# Patient Record
Sex: Male | Born: 1980 | Race: White | Hispanic: No | Marital: Married | Smoking: Never smoker
Health system: Southern US, Community
[De-identification: ages and names within clinical notes are randomized; demographics above are authoritative.]

## PROBLEM LIST (undated history)

## (undated) DIAGNOSIS — K219 Gastro-esophageal reflux disease without esophagitis: Secondary | ICD-10-CM

## (undated) DIAGNOSIS — G44209 Tension-type headache, unspecified, not intractable: Secondary | ICD-10-CM

## (undated) DIAGNOSIS — D4709 Other mast cell neoplasms of uncertain behavior: Secondary | ICD-10-CM

## (undated) DIAGNOSIS — E785 Hyperlipidemia, unspecified: Secondary | ICD-10-CM

## (undated) DIAGNOSIS — K760 Fatty (change of) liver, not elsewhere classified: Secondary | ICD-10-CM

## (undated) DIAGNOSIS — J302 Other seasonal allergic rhinitis: Secondary | ICD-10-CM

## (undated) HISTORY — DX: Tension-type headache, unspecified, not intractable: G44.209

## (undated) HISTORY — DX: Fatty (change of) liver, not elsewhere classified: K76.0

## (undated) HISTORY — DX: Other seasonal allergic rhinitis: J30.2

## (undated) HISTORY — DX: Other mast cell neoplasms of uncertain behavior: D47.09

## (undated) HISTORY — DX: Hyperlipidemia, unspecified: E78.5

## (undated) HISTORY — DX: Gastro-esophageal reflux disease without esophagitis: K21.9

---

## 2008-03-19 LAB — HEPATIC FUNCTION PANEL
ALT: 50 U/L — AB (ref 10–40)
AST: 29 U/L
GGT: 274
Total Bilirubin: 0.4 mg/dL

## 2008-03-19 LAB — GLUCOSE, RANDOM: Fructosamine: 2.3

## 2008-03-19 LAB — LIPID PANEL: Cholesterol, Total: 202

## 2008-03-19 LAB — HEPATITIS B SURFACE ANTIGEN: Hepatitis B Surface Antigen: NEGATIVE

## 2008-03-19 LAB — HEPATITIS C ANTIBODY: Hepatitis C Ab: NEGATIVE

## 2008-04-02 HISTORY — PX: OTHER SURGICAL HISTORY: SHX169

## 2008-07-13 LAB — HEPATIC FUNCTION PANEL
Alkaline Phosphatase: 65 U/L
Bilirubin, Direct: 0.11 mg/dL (ref 0.01–0.4)
Total Bilirubin: 0.3 mg/dL
Total Protein: 7.3 g/dL

## 2008-07-13 LAB — LIPID PANEL: Triglycerides: 66

## 2010-04-14 ENCOUNTER — Ambulatory Visit (INDEPENDENT_AMBULATORY_CARE_PROVIDER_SITE_OTHER): Payer: PRIVATE HEALTH INSURANCE | Admitting: Family Medicine

## 2010-04-14 ENCOUNTER — Encounter: Payer: Self-pay | Admitting: Family Medicine

## 2010-04-14 ENCOUNTER — Other Ambulatory Visit: Payer: Self-pay | Admitting: Family Medicine

## 2010-04-14 DIAGNOSIS — E669 Obesity, unspecified: Secondary | ICD-10-CM | POA: Insufficient documentation

## 2010-04-14 DIAGNOSIS — K7689 Other specified diseases of liver: Secondary | ICD-10-CM

## 2010-04-14 DIAGNOSIS — K219 Gastro-esophageal reflux disease without esophagitis: Secondary | ICD-10-CM

## 2010-04-14 DIAGNOSIS — E1169 Type 2 diabetes mellitus with other specified complication: Secondary | ICD-10-CM | POA: Insufficient documentation

## 2010-04-14 DIAGNOSIS — E785 Hyperlipidemia, unspecified: Secondary | ICD-10-CM | POA: Insufficient documentation

## 2010-04-14 DIAGNOSIS — L989 Disorder of the skin and subcutaneous tissue, unspecified: Secondary | ICD-10-CM | POA: Insufficient documentation

## 2010-04-14 DIAGNOSIS — R7309 Other abnormal glucose: Secondary | ICD-10-CM | POA: Insufficient documentation

## 2010-04-14 DIAGNOSIS — G44209 Tension-type headache, unspecified, not intractable: Secondary | ICD-10-CM

## 2010-04-14 DIAGNOSIS — E66811 Obesity, class 1: Secondary | ICD-10-CM | POA: Insufficient documentation

## 2010-04-14 DIAGNOSIS — E663 Overweight: Secondary | ICD-10-CM | POA: Insufficient documentation

## 2010-04-14 DIAGNOSIS — K760 Fatty (change of) liver, not elsewhere classified: Secondary | ICD-10-CM | POA: Insufficient documentation

## 2010-04-14 DIAGNOSIS — N509 Disorder of male genital organs, unspecified: Secondary | ICD-10-CM | POA: Insufficient documentation

## 2010-04-14 DIAGNOSIS — J309 Allergic rhinitis, unspecified: Secondary | ICD-10-CM | POA: Insufficient documentation

## 2010-04-14 LAB — CBC WITH DIFFERENTIAL/PLATELET
Basophils Relative: 0.5 % (ref 0.0–3.0)
Eosinophils Relative: 0.4 % (ref 0.0–5.0)
HCT: 40.3 % (ref 39.0–52.0)
MCV: 81.8 fl (ref 78.0–100.0)
Monocytes Absolute: 0.5 10*3/uL (ref 0.1–1.0)
Monocytes Relative: 7.9 % (ref 3.0–12.0)
Neutrophils Relative %: 77.8 % — ABNORMAL HIGH (ref 43.0–77.0)
RBC: 4.93 Mil/uL (ref 4.22–5.81)
WBC: 6.3 10*3/uL (ref 4.5–10.5)

## 2010-04-14 LAB — BASIC METABOLIC PANEL
Chloride: 99 mEq/L (ref 96–112)
Creatinine, Ser: 1 mg/dL (ref 0.4–1.5)
Potassium: 4.1 mEq/L (ref 3.5–5.1)

## 2010-04-14 LAB — LIPID PANEL
HDL: 35.9 mg/dL — ABNORMAL LOW (ref >39.00)
Triglycerides: 96 mg/dL (ref 0.0–149.0)
VLDL: 19.2 mg/dL (ref 0.0–40.0)

## 2010-04-14 LAB — HEPATIC FUNCTION PANEL
ALT: 42 U/L (ref 0–53)
AST: 28 U/L (ref 0–37)
Bilirubin, Direct: 0.1 mg/dL (ref 0.0–0.3)
Total Protein: 7.8 g/dL (ref 6.0–8.3)

## 2010-04-14 LAB — TSH: TSH: 0.99 u[IU]/mL (ref 0.35–5.50)

## 2010-04-22 NOTE — Assessment & Plan Note (Signed)
Summary: NEW PATIENT EST / LFW   Vital Signs:  Patient profile:   30 year old male Height:      70.5 inches Weight:      215 pounds BMI:     30.52 Temp:     98.4 degrees F oral Pulse rate:   80 / minute Pulse rhythm:   regular BP sitting:   124 / 64  (left arm) Cuff size:   large  Vitals Entered By: Selena Batten Dance CMA Duncan Dull) (April 14, 2010 9:36 AM) CC: New patient to establish care   History of Present Illness: CC: new patient, establish  previously saw [facility] in [place], [place].  went there for early signs of fatty liver disease.  did liver US, minimal dz.  Also with gallbladder polyps.  Saw GI, told could handle with diet/exercise changes.  prescribed some medicine to lower trig's stopped taking because wanted to try diet changes, too expensive.  never had liver bx.  h/o mastocytosis.  saw derm, told should go away over time.  manifested as skin breakoutw hen went to [facility] for honeymoon.  testicular issues - has had pain in past, currently no pain.  No lumps/bumps.  comes and goes.  hasn't been seen for that before.  preventative: tetanus shot - 2006 no recent blood work (last 2010)  -  Date:  02/02/2006    TD booster Td per pt  Current Medications (verified): 1)  Loratadine 10 Mg Tabs (Loratadine) .Marland Kitchen.. 1 By Mouth Once Daily As Needed  Allergies (verified): No Known Drug Allergies  Past History:  Past Medical History: GERD allergic rhinitis mastocytosis? NAFLD HLD tension headache  Past Surgical History: none  Family History: M: T2DM F: HTN grandparents: CAD (none early), breast CA, CVA,  MGF: kidney disease, CEA, deafness PGF: melanoma MGM: CHF  No other CA  Social History: no smoking, no EtOH, no rec drugs caffeine: 16-32oz soda Occupation: physical therapist, [employer] Lives with wife and daughter (2009), 1 dog, 1 cat Edu: Grad school, doctor of PT  Review of Systems  The patient denies anorexia, fever, weight loss, weight  gain, vision loss, decreased hearing, hoarseness, chest pain, syncope, dyspnea on exertion, peripheral edema, prolonged cough, headaches, hemoptysis, abdominal pain, melena, hematochezia, severe indigestion/heartburn, hematuria, depression, and testicular masses.         10pt ros o/w neg.  Physical Exam  General:  Well-developed,well-nourished,in no acute distress; alert,appropriate and cooperative throughout examination Head:  Normocephalic and atraumatic without obvious abnormalities. No apparent alopecia or balding. Eyes:  No corneal or conjunctival inflammation noted. EOMI. Perrla. Ears:  TMs clear bilaterally Nose:  nares clear  Mouth:  pharynx without erythema/exudates, MMM, good dentition.   Neck:  No deformities, masses, or tenderness noted. Lungs:  Normal respiratory effort, chest expands symmetrically. Lungs are clear to auscultation, no crackles or wheezes. Heart:  Normal rate and regular rhythm. S1 and S2 normal without gallop, murmur, click, rub or other extra sounds. Abdomen:  Bowel sounds positive,abdomen soft and non-tender without masses, organomegaly or hernias noted. Genitalia:  Testes bilaterally descended without nodularity, tenderness or masses. No scrotal masses or lesions. No penis lesions or urethral discharge.  Msk:  No deformity or scoliosis noted of thoracic or lumbar spine.   Pulses:  2+ rad pulses bilaterally, brisk cap refill Extremities:  no pedal edema Neurologic:  CN grossly intact, station and gait intact Skin:  Intact without suspicious lesions or rashes Psych:  full affect, pleasant and cooperative   Impression &  Recommendations:  Problem # 1:  FATTY LIVER DISEASE (ICD-571.8) discussed etiology, progression to cirrhosis (sounds like not likely).  ?NASH vs HS.  check blood work, request records, may rec statin.  encouraged low fat diet, start exercise routine (walking with family?)  Orders: Venipuncture (21308) TLB-BMP (Basic Metabolic Panel-BMET)  (80048-METABOL) TLB-CBC Platelet - w/Differential (85025-CBCD) TLB-Hepatic/Liver Function Pnl (80076-HEPATIC) TLB-TSH (Thyroid Stimulating Hormone) (84443-TSH) TLB-Lipid Panel (80061-LIPID)  Problem # 2:  GERD (ICD-530.81) diet controlled.  Problem # 3:  DYSLIPIDEMIA (ICD-272.4) check FLP   Problem # 4:  SKIN DISORDER (ICD-709.9) per pt h/o cutaneous mastocytosis.  saw derm.  told should clear up on its own.  Problem # 5:  ALLERGIC RHINITIS (ICD-477.9) controlled on loratadine.  His updated medication list for this problem includes:    Loratadine 10 Mg Tabs (Loratadine) .Marland Kitchen... 1 by mouth once daily as needed  Problem # 6:  TESTICULAR PAIN, LEFT (ICD-608.9) currently not bothering him, exam wnl.  ? noninfectious epididymitis.  monitor, return if flares.  Complete Medication List: 1)  Loratadine 10 Mg Tabs (Loratadine) .Marland Kitchen.. 1 by mouth once daily as needed  Patient Instructions: 1)  we will get records from prior PCP. 2)  Blood work today. 3)  Return in 1-2 years or as needed for next physical. 4)  Call us with questions, good to meet you today.   Orders Added: 1)  Venipuncture [36415] 2)  TLB-BMP (Basic Metabolic Panel-BMET) [80048-METABOL] 3)  TLB-CBC Platelet - w/Differential [85025-CBCD] 4)  TLB-Hepatic/Liver Function Pnl [80076-HEPATIC] 5)  TLB-TSH (Thyroid Stimulating Hormone) [84443-TSH] 6)  TLB-Lipid Panel [80061-LIPID] 7)  New Patient Level IV [65784]    Current Allergies (reviewed today): No known allergies   Appended Document: NEW PATIENT EST / LFW    Clinical Lists Changes  Problems: Added new problem of PREDIABETES (ICD-790.29)

## 2010-06-22 ENCOUNTER — Encounter: Payer: Self-pay | Admitting: Family Medicine

## 2010-06-24 ENCOUNTER — Encounter: Payer: Self-pay | Admitting: Family Medicine

## 2010-06-25 ENCOUNTER — Encounter: Payer: Self-pay | Admitting: Family Medicine

## 2011-10-27 ENCOUNTER — Encounter: Payer: Self-pay | Admitting: Family Medicine

## 2011-10-27 ENCOUNTER — Ambulatory Visit (INDEPENDENT_AMBULATORY_CARE_PROVIDER_SITE_OTHER): Payer: PRIVATE HEALTH INSURANCE | Admitting: Family Medicine

## 2011-10-27 VITALS — BP 118/76 | HR 72 | Temp 98.5°F | Ht 70.5 in | Wt 196.8 lb

## 2011-10-27 DIAGNOSIS — R1011 Right upper quadrant pain: Secondary | ICD-10-CM

## 2011-10-27 DIAGNOSIS — Z0000 Encounter for general adult medical examination without abnormal findings: Secondary | ICD-10-CM | POA: Insufficient documentation

## 2011-10-27 DIAGNOSIS — E663 Overweight: Secondary | ICD-10-CM

## 2011-10-27 NOTE — Patient Instructions (Signed)
Pass by Alan Barber's office for referral for abdominal ultrasound. Good job with weight and diet - keep it up. Good to see you today. Call us with questions.

## 2011-10-27 NOTE — Assessment & Plan Note (Signed)
Preventative protocols reviewed and updated unless pt declined. Discussed healthy diet and lifestyle. Encouraged healthy diet changes, weight loss through increased activity. Declines flu shot today.

## 2011-10-27 NOTE — Assessment & Plan Note (Signed)
Longstanding, some biliary colic characteristics.  H/o gallbladder polyps.  H/o NAFLD.  Repeat abd [place] and check LFTs.  Will decide plan based on results.  Pt agrees with plan. Knows red flags to seek urgent care.

## 2011-10-27 NOTE — Progress Notes (Signed)
Subjective:    Patient ID: Alan Barber, male    DOB: [birth year], 31 y.o.   MRN: [id number]  HPI CC: CPE  RUQ pain - ?gallbladder pain - off and on for last 5 years.  Worsening over last 1.5 yrs.  Works discomfort after fatty meals - pressure/bloating in RUQ.  Had 2 episodes of significant pain associated with this as well.  Pain described as dull pain lasting 4-5 hours, crescendo worsening pain.  Almost went to ER.  Ibuprofen helped.  This happened early 2013.  Some diarrhea associated with this.  H/o abd [place] with gallbladder polyps and 2 small stones per pt.  H/o NAFLD.  Prior saw GI in [place] (Dr. Danielle Dess?)  No nausea/vomiting, diarrhea/constipation, fevers/chills.  Lost 10lbs through fast at church.  also started P90X in fall.  Has regained some of this weight. 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  Past histories reviewed and updated if relevant as below. Patient Active Problem List  Diagnosis  . DYSLIPIDEMIA  . OVERWEIGHT  . TENSION HEADACHE  . ALLERGIC RHINITIS  . GERD  . FATTY LIVER DISEASE  . TESTICULAR PAIN, LEFT  . SKIN DISORDER  . PREDIABETES   Past Medical History  Diagnosis Date  . GERD (gastroesophageal reflux disease)   . Seasonal allergies   . Mastocytosis   . Nonalcoholic fatty liver disease     has seen GI (Dr. Danielle Dess, [place])  . HLD (hyperlipidemia)   . Tension headache   . Hypertriglyceridemia    Past Surgical History  Procedure Date  . Abd ultrasound 04/2008    small gallbladder polyps, mild fatty infiltration of liver   History  Substance Use  Topics  . Smoking status: Never Smoker   . Smokeless tobacco: Never Used  . Alcohol Use: No   Family History  Problem Relation Age of Onset  . Diabetes type II Mother   . Hypertension Father   . Breast cancer Paternal Grandmother   . Kidney disease Maternal Grandfather   . Cancer Paternal Grandfather     skin (?melanoma)  . Heart disease Maternal Grandmother    No Known Allergies No current outpatient prescriptions on file prior to visit.      Review of Systems  Constitutional: Negative for fever, chills, activity change, appetite change, fatigue and unexpected weight change.  HENT: Negative for hearing loss and neck pain.   Eyes: Negative for visual disturbance.  Respiratory: Negative for cough, chest tightness, shortness of breath and wheezing.   Cardiovascular: Negative for chest pain, palpitations and leg swelling.  Gastrointestinal: Positive for abdominal pain and blood in stool (with wiping). Negative for nausea, vomiting, diarrhea, constipation and abdominal distention.  Genitourinary: Negative for hematuria and difficulty urinating.  Musculoskeletal: Negative for myalgias and arthralgias.  Skin: Negative for rash.  Neurological: Negative for dizziness, seizures, syncope and headaches.  Hematological: Does not bruise/bleed easily.  Psychiatric/Behavioral: Negative for dysphoric mood. The patient is not nervous/anxious.        Objective:   Physical Exam  Nursing note and vitals reviewed. Constitutional: He is oriented to person, place, and time. He  appears well-developed and well-nourished. No distress.  HENT:  Head: Normocephalic and atraumatic.  Right Ear: External ear normal.  Left Ear: External ear normal.  Nose: Nose normal.  Mouth/Throat: Oropharynx is clear and moist. No oropharyngeal exudate.  Eyes: Conjunctivae normal and EOM are normal. Pupils are equal, round, and reactive to light. No scleral icterus.  Neck: Normal range of motion. Neck supple. Carotid  bruit is not present.  Cardiovascular: Normal rate, regular rhythm, normal heart sounds and intact distal pulses.   No murmur heard. Pulses:      Radial pulses are 2+ on the right side, and 2+ on the left side.  Pulmonary/Chest: Effort normal and breath sounds normal. No respiratory distress. He has no wheezes. He has no rales.  Abdominal: Soft. Bowel sounds are normal. He exhibits no distension and no mass. There is no hepatosplenomegaly. There is no tenderness. There is no rebound, no guarding, no CVA tenderness and negative Murphy's sign.       Mild discomfort to deep palpation RUQ with inspiration  Musculoskeletal: Normal range of motion. He exhibits no edema.  Lymphadenopathy:    He has no cervical adenopathy.  Neurological: He is alert and oriented to person, place, and time.       CN grossly intact, station and gait intact  Skin: Skin is warm and dry. No rash noted.  Psychiatric: He has a normal mood and affect. His behavior is normal. Judgment and thought content normal.       Assessment & Plan:

## 2011-10-27 NOTE — Assessment & Plan Note (Signed)
Body mass index is 27.83 kg/(m^2). Return fasting for blood work.

## 2011-10-28 ENCOUNTER — Ambulatory Visit: Payer: Self-pay | Admitting: Family Medicine

## 2011-10-28 ENCOUNTER — Other Ambulatory Visit: Payer: Self-pay | Admitting: Family Medicine

## 2011-10-28 ENCOUNTER — Encounter: Payer: Self-pay | Admitting: Family Medicine

## 2011-10-28 DIAGNOSIS — K8050 Calculus of bile duct without cholangitis or cholecystitis without obstruction: Secondary | ICD-10-CM

## 2011-10-28 DIAGNOSIS — R1011 Right upper quadrant pain: Secondary | ICD-10-CM

## 2011-10-28 DIAGNOSIS — K8020 Calculus of gallbladder without cholecystitis without obstruction: Secondary | ICD-10-CM

## 2011-11-03 HISTORY — PX: LAPAROSCOPIC CHOLECYSTECTOMY: SUR755

## 2011-12-01 ENCOUNTER — Ambulatory Visit: Payer: Self-pay | Admitting: Surgery

## 2011-12-18 ENCOUNTER — Encounter: Payer: Self-pay | Admitting: Family Medicine

## 2013-12-28 IMAGING — US ABDOMEN ULTRASOUND
1 series · 13 of 25 positions shown · non-contrast
Comparison: none

REASON FOR EXAM: RUQ abd pain
COMMENTS:

[Series 1: abdomen ultrasound · 0.31mm/px · 13 of 122 slices shown]
[im 1/122]
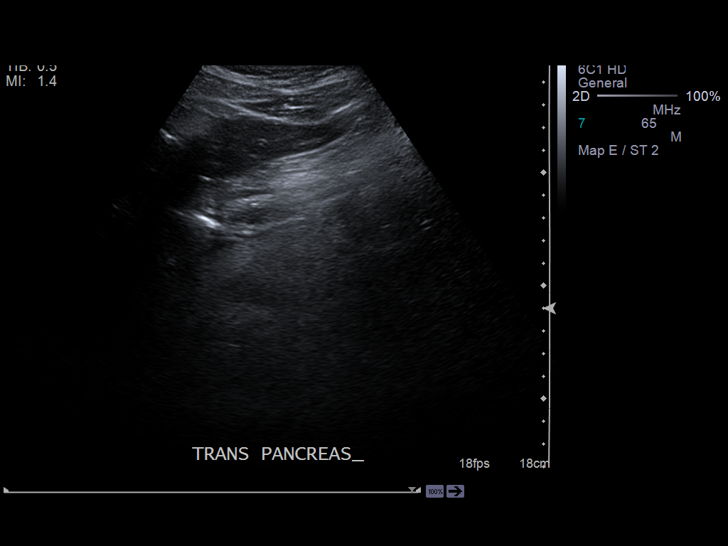
[im 11/122]
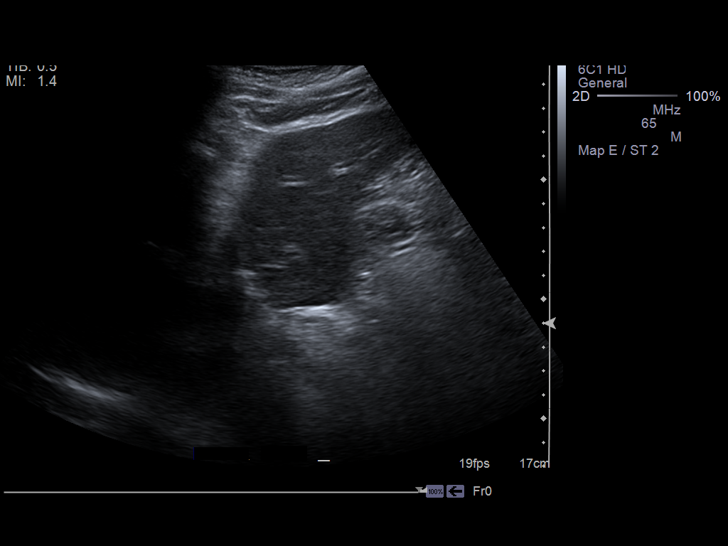
[im 21/122]
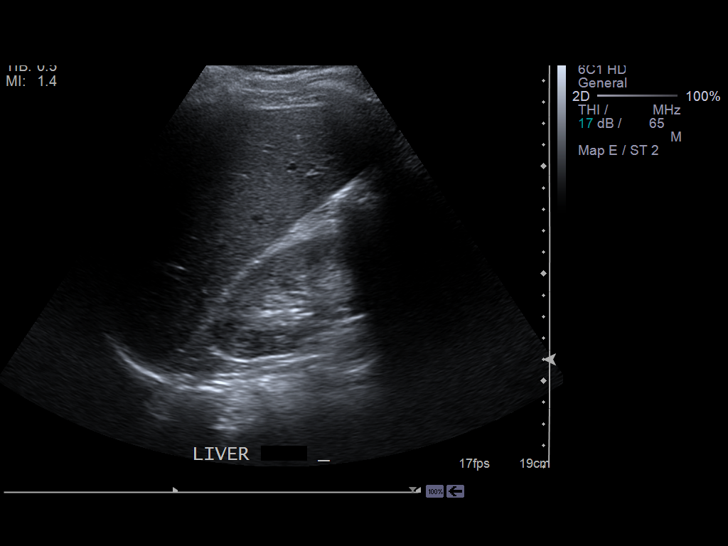
[im 31/122]
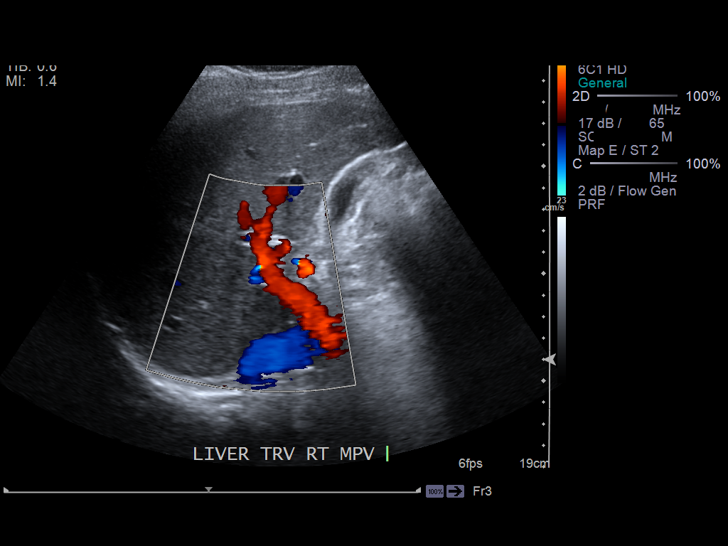
[im 41/122]
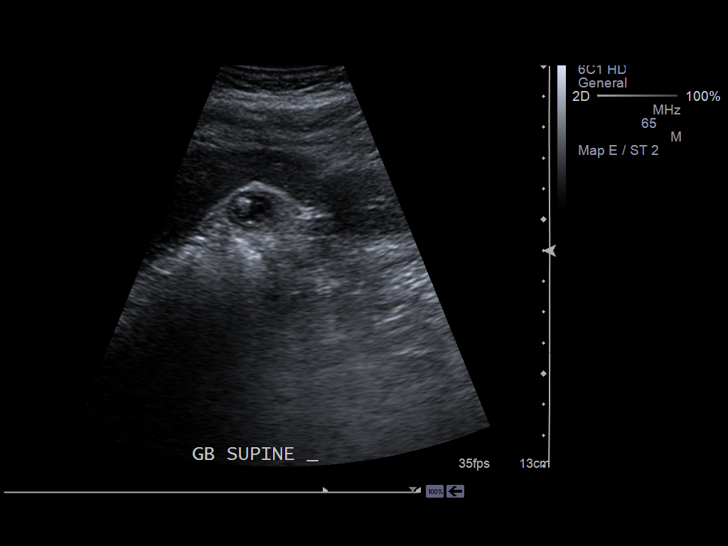
[im 51/122]
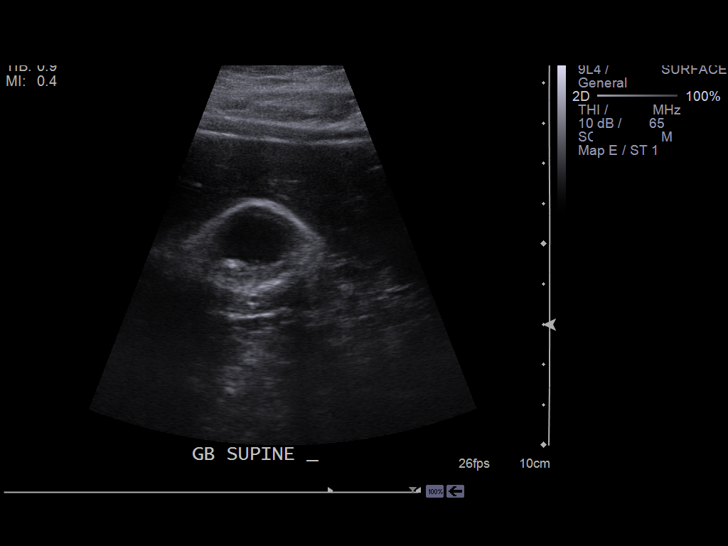
[im 61/122]
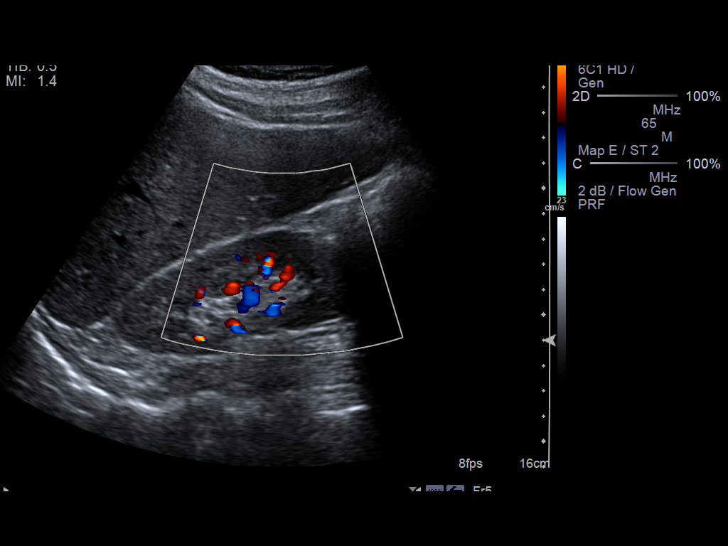
[im 71/122]
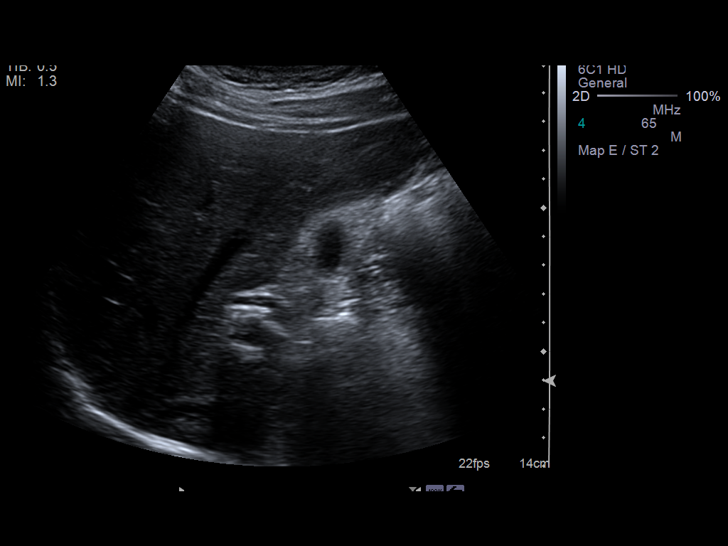
[im 81/122]
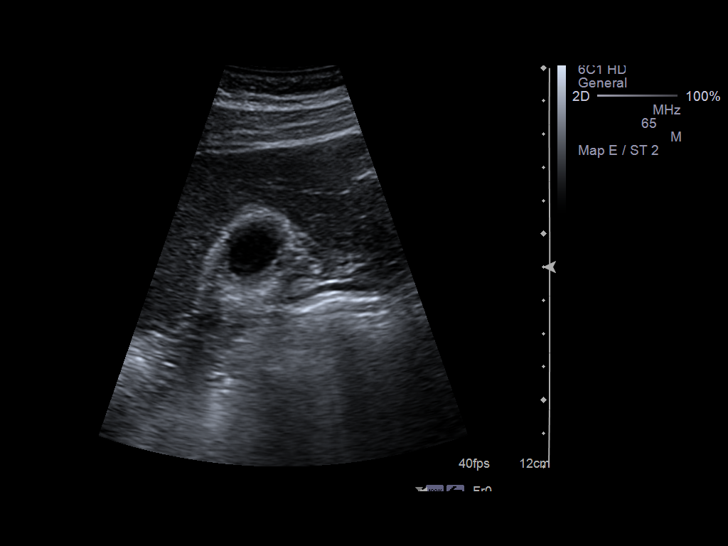
[im 91/122]
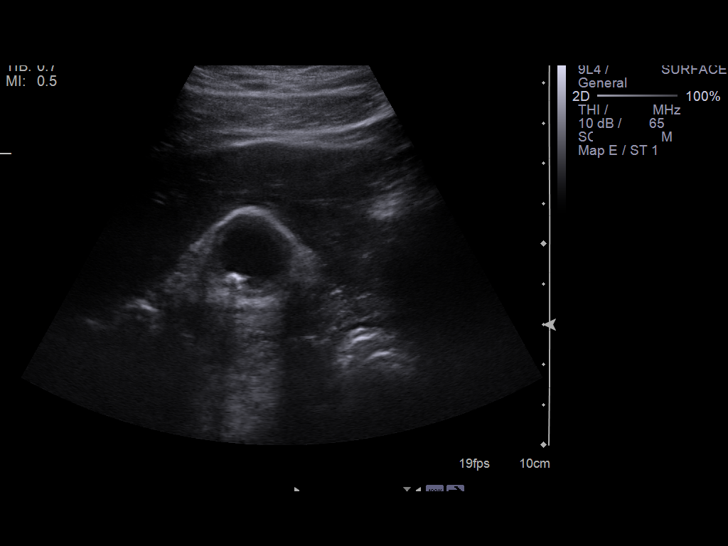
[im 101/122]
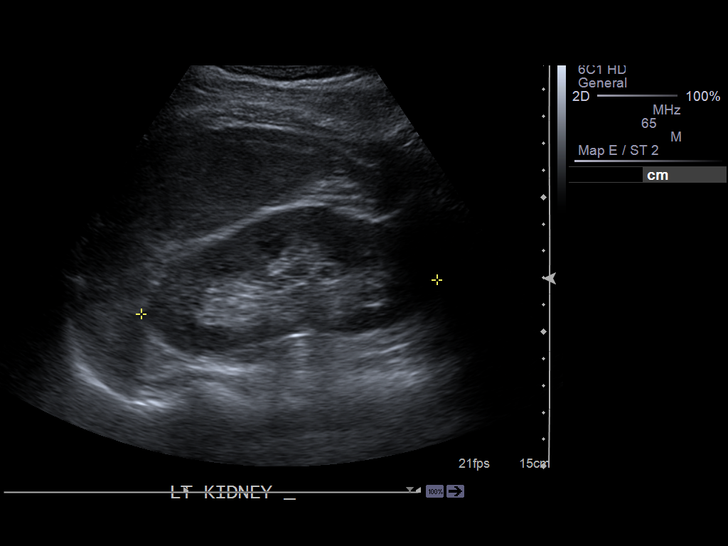
[im 111/122]
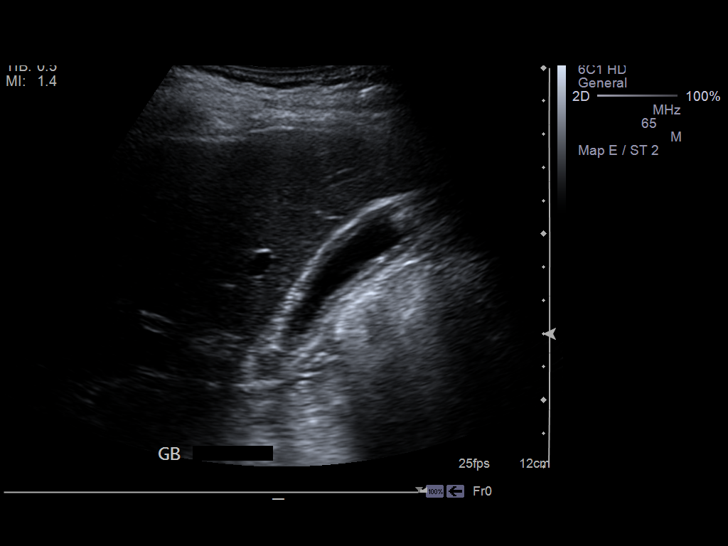
[im 122/122]
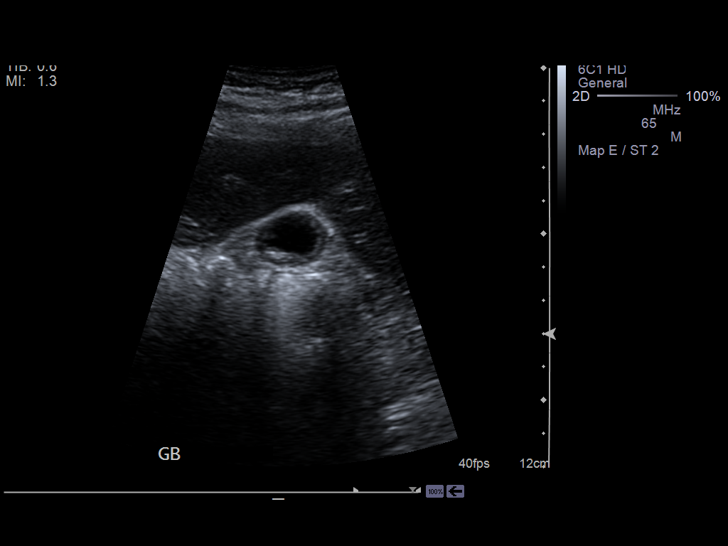

[13 of 25 positions shown; findings below may reference images not displayed]

PROCEDURE:     US  - US ABDOMEN GENERAL SURVEY  - October 28, 2011  [DATE]

RESULT:     The liver exhibits normal echotexture with no focal mass nor
ductal dilation. Portal venous flow is normal in direction toward the liver.
There is no evidence of ascites. The gallbladder is adequately distended and
contains multiple echogenic and partially shadowing stones which move
somewhat but the gallbladder is contracted. The wall is thickened at 4.4 mm.
There is no pericholecystic fluid. There is no positive sonographic Murphy's
sign. There is echogenic material associated with the gallbladder wall which
could reflect the presence of mural calcification.

The common bile duct is normal at 4.2 mm. Evaluation of the pancreas was
limited by bowel gas. The spleen, abdominal aorta, inferior vena cava, and
kidneys are normal in appearance.
IMPRESSION: 1. The gallbladder demonstrates a thickened wall but overall is contracted.
Mobile as well as adherent echogenic shadowing foci may reflect stones
and/or mural calcification. The patient reportedly has a history of both.
There is no positive sonographic Murphy's sign.
2. Evaluation of the pancreas was limited.
3. The appearance of the liver and common bile duct is normal.
4. There is no acute abnormality demonstrated elsewhere within the abdomen.

[REDACTED]

## 2014-05-22 NOTE — Op Note (Signed)
PATIENT NAME:  Alan Barber, Alan Barber MR#:  [id number] DATE OF BIRTH:  1981/01/28  DATE OF PROCEDURE:  12/01/2011  PREOPERATIVE DIAGNOSIS: Chronic cholecystitis with cholelithiasis.   POSTOPERATIVE DIAGNOSIS: Chronic cholecystitis with cholelithiasis.   PROCEDURE: Laparoscopic cholecystectomy.   SURGEON:  Rochel Brome, M.D.   ANESTHESIA: General.   INDICATIONS: This 34 year old male has a history of epigastric pains in the right upper abdomen of many years duration. He had ultrasound which demonstrated thickening of gallbladder wall and gallstones. Surgery was recommended for definitive treatment.   DESCRIPTION OF PROCEDURE: The patient was placed on the operating table in the supine position under general endotracheal anesthesia. The abdomen was prepared with ChloraPrep and draped in a sterile manner. The first incision was made in the inferior aspect of the umbilicus and carried down to the deep fascia which was grasped with laryngeal hook and elevated. A Veress needle was inserted, aspirated, and irrigated with a saline solution. Next, the peritoneal cavity was inflated with carbon dioxide. The Veress needle was removed. The 10 mm cannula was inserted. The 10 mm, 0 degree laparoscope was inserted to view the peritoneal cavity. The liver appeared normal. Another incision was made in the epigastrium slightly to the right of the midline to introduce an 11 mm cannula. Two incisions were made in the lateral aspect of the right upper quadrant to introduce two 5-mm cannulas. The gallbladder is found to have a thickened wall. It was retracted towards the right shoulder. The location of the porta hepatis was demonstrated. The pouch of Randol Kern was retracted inferiorly and laterally. The dissection was carried out dissecting through some fatty tissue and exposed the cystic duct which appeared to be somewhat large in size. It was dissected free from surrounding structures. The cystic artery was dissected free from  surrounding structures. A critical view of safety was demonstrated. The cystic artery was controlled with Endoclips and divided, allowing better traction on the cystic duct. An Endoclip was placed across the cystic duct adjacent to the neck of the gallbladder. An incision was made in the cystic duct. There was a tiny bit of white bile identified. The Reddick catheter was inserted; however, would not thread down the cystic duct. It appeared that it was occluded and, therefore, a cholangiogram was not done. The cystic duct was triply ligated with Endoclips and divided. The gallbladder was dissected free from the liver with use of hook and cautery. Bleeding was very scant. Hemostasis was subsequently intact. The gallbladder was delivered up through the infraumbilical incision, opened and suctioned. There was just a scant bit of white bile. It was necessary to lengthen the skin incision by about 6 mm and lengthen the fascial incision by 6 mm to allow removal of the gallbladder. It did have some fine stones and some thickening of the gallbladder wall. It was submitted in formalin for routine pathology. The right upper quadrant was further inspected. Hemostasis was intact. The cannulas were removed. Carbon dioxide was allowed to escape from the peritoneal cavity. The fascia at the umbilical incision was closed with a 0 Vicryl figure-of-eight suture. The skin incisions were inspected. Several tiny subcutaneous bleeding points were cauterized. Each wound was closed with interrupted 5-0 chromic subcuticular sutures, benzoin, and Steri-Strips. Dressings were applied with paper tape. The patient tolerated surgery satisfactorily and was then prepared for transfer to the recovery room.   ____________________________ Lenna Sciara. Rochel Brome, MD jws:ap D: 12/01/2011 10:42:21 ET T: 12/01/2011 11:05:46 ET JOB#: [id number]  cc: Loreli Dollar, MD, <Dictator> Macy Mis  Ivyana Locey MD ELECTRONICALLY SIGNED 12/02/2011 10:37

## 2014-08-21 ENCOUNTER — Ambulatory Visit (INDEPENDENT_AMBULATORY_CARE_PROVIDER_SITE_OTHER): Payer: BLUE CROSS/BLUE SHIELD | Admitting: Family Medicine

## 2014-08-21 ENCOUNTER — Encounter: Payer: Self-pay | Admitting: Family Medicine

## 2014-08-21 VITALS — BP 114/78 | HR 76 | Temp 98.5°F | Ht 70.25 in | Wt 207.8 lb

## 2014-08-21 DIAGNOSIS — S70361S Insect bite (nonvenomous), right thigh, sequela: Secondary | ICD-10-CM

## 2014-08-21 DIAGNOSIS — R509 Fever, unspecified: Secondary | ICD-10-CM | POA: Diagnosis not present

## 2014-08-21 DIAGNOSIS — W57XXXA Bitten or stung by nonvenomous insect and other nonvenomous arthropods, initial encounter: Secondary | ICD-10-CM

## 2014-08-21 DIAGNOSIS — S70361A Insect bite (nonvenomous), right thigh, initial encounter: Secondary | ICD-10-CM | POA: Insufficient documentation

## 2014-08-21 DIAGNOSIS — W57XXXS Bitten or stung by nonvenomous insect and other nonvenomous arthropods, sequela: Secondary | ICD-10-CM

## 2014-08-21 MED ORDER — DOXYCYCLINE HYCLATE 100 MG PO TABS: 100.0000 mg | ORAL_TABLET | Freq: Two times a day (BID) | ORAL | Status: DC

## 2014-08-21 NOTE — Progress Notes (Signed)
Pre visit review using our clinic review tool, if applicable. No additional management support is needed unless otherwise documented below in the visit note. 

## 2014-08-21 NOTE — Patient Instructions (Signed)
Complete course of doxycycline x 10 days for tick borne disease prophylaxis.  Stop at lab on way out.  Call if GI illness starts an unable to keep down antibiotics.  Go to ER fever on antibiotics, or confusion, neuro symptoms.  Push fluids.

## 2014-08-21 NOTE — Progress Notes (Signed)
   Subjective:    Patient ID: Alan Barber, male    DOB: [birth year], 34 y.o.   MRN: [id number]  HPI 34 year old male pt of Dr. Darnell Level presents with history of tick bite 2 weeks ago. Deer tick removed from right posterior thigh.  Redness around  bite for a few days, no target lesion. No other rash.  In last 24 hours fever ( 100.7), chills, body aches, headache mild. Now feels some better but fatigue. No emesis, no diarrhea. Does have mild nausea today. Hips and knees achy. Neck stiffness yesterday, none now. No new neuro changes, no confusion.  Family members GI  illness, diarrhea  He has history of lyme disease age late 37s.   Has not taken anything for his symptoms.     Review of Systems  Constitutional: Positive for fever and fatigue. Negative for unexpected weight change.  HENT: Negative for ear pain.   Eyes: Negative for pain.  Respiratory: Negative for cough and shortness of breath.   Cardiovascular: Negative for chest pain.  Gastrointestinal: Negative for abdominal pain.       Objective:   Physical Exam  Constitutional: Vital signs are normal. He appears well-developed and well-nourished.  HENT:  Head: Normocephalic.  Right Ear: Hearing normal.  Left Ear: Hearing normal.  Nose: Nose normal.  Mouth/Throat: Oropharynx is clear and moist and mucous membranes are normal.  Neck: Trachea normal. Carotid bruit is not present. No thyroid mass and no thyromegaly present.  Cardiovascular: Normal rate, regular rhythm and normal pulses.  Exam reveals no gallop, no distant heart sounds and no friction rub.   No murmur heard. No peripheral edema  Pulmonary/Chest: Effort normal and breath sounds normal. No respiratory distress.  Abdominal: Normal appearance and bowel sounds are normal. There is no tenderness.  Skin: Skin is warm, dry and intact. No rash noted.  Psychiatric: He has a normal mood and affect. His speech is normal and behavior is normal. Thought content normal.           Assessment & Plan:   Fatigue, joint pain, myalgia, fever 2 weeks after tick bite:  Concern for tick borne illness but may also e early GI illness as in family.  Will treat with doxy emperically, eval with labs.

## 2014-08-23 LAB — ROCKY MTN SPOTTED FVR ABS PNL(IGG+IGM)
RMSF IGM: 0.12 IV
RMSF IgG: 0.14 IV

## 2014-08-23 LAB — B. BURGDORFI ANTIBODIES: B BURGDORFERI AB IGG+ IGM: 0.3 {ISR}

## 2014-08-26 LAB — EHRLICHIA ANTIBODY PANEL: E chaffeensis (HGE) Ab, IgG: <1:64 {titer}

## 2016-02-13 ENCOUNTER — Ambulatory Visit (INDEPENDENT_AMBULATORY_CARE_PROVIDER_SITE_OTHER): Payer: BLUE CROSS/BLUE SHIELD | Admitting: Internal Medicine

## 2016-02-13 ENCOUNTER — Encounter: Payer: Self-pay | Admitting: Internal Medicine

## 2016-02-13 VITALS — BP 132/94 | HR 107 | Temp 101.9°F | Wt 214.0 lb

## 2016-02-13 DIAGNOSIS — J101 Influenza due to other identified influenza virus with other respiratory manifestations: Secondary | ICD-10-CM | POA: Diagnosis not present

## 2016-02-13 LAB — POCT INFLUENZA A: RAPID INFLUENZA A AGN: POSITIVE

## 2016-02-13 MED ORDER — OSELTAMIVIR PHOSPHATE 75 MG PO CAPS: 75.0000 mg | ORAL_CAPSULE | Freq: Two times a day (BID) | ORAL | 1 refills | Status: DC

## 2016-02-13 NOTE — Assessment & Plan Note (Signed)
Clear symptoms though fairly mild Within 24 hours Discussed pros and cons of tamilfu---will proceed Discussed supportive care

## 2016-02-13 NOTE — Progress Notes (Signed)
   Subjective:    Patient ID: Alan Barber, male    DOB: 11-15-1980, 36 y.o.   MRN: [id number]  HPI Here due to flu like symptoms  Started aching in neck late yesterday Has had fever--aching, shakes, chills Slight cough No sore throat or ear pain Slight DOE--gets tired quick  No meds for this  Current Outpatient Prescriptions on File Prior to Visit  Medication Sig Dispense Refill  . Probiotic Product (PROBIOTIC DAILY PO) Take by mouth daily.     No current facility-administered medications on file prior to visit.     No Known Allergies  Past Medical History:  Diagnosis Date  . GERD (gastroesophageal reflux disease)   . HLD (hyperlipidemia)   . Mastocytosis   . Nonalcoholic fatty liver disease    has seen GI (Dr. Justus Memory, [place])  . Seasonal allergies   . Tension headache     Past Surgical History:  Procedure Laterality Date  . abd ultrasound  04/2008   small gallbladder polyps, mild fatty infiltration of liver  . LAPAROSCOPIC CHOLECYSTECTOMY  11/2011   gallstones    Family History  Problem Relation Age of Onset  . Diabetes type II Mother   . Hypertension Father   . Breast cancer Paternal Grandmother   . Kidney disease Maternal Grandfather   . Cancer Paternal Grandfather     skin (?melanoma)  . Heart disease Maternal Grandmother     Social History   Social History  . Marital status: Married    Spouse name: N/A  . Number of children: N/A  . Years of education: N/A   Occupational History  . PT     [employer] Topics  . Smoking status: Never Smoker  . Smokeless tobacco: Never Used  . Alcohol use No  . Drug use: No  . Sexual activity: Not on file   Other Topics Concern  . Not on file   Social History Narrative   caffeine: 16oz soda/day   Occupation: physical therapist, [employer]   Lives with wife and daughter (2009), 1 dog, 1 cat   Edu: Grad school, doctor of PT   Activity: lifts weights, some running   Diet:  good water, fruits/vegetables daily   Review of Systems No rash No vomiting or diarrhea Appetite is okay    Objective:   Physical Exam  Constitutional: He appears well-nourished. No distress.  HENT:  Slight pharyngeal injection Mild nasal inflammation No sinus tenderness TMs normal  Neck: Normal range of motion. Neck supple. No thyromegaly present.  Pulmonary/Chest: Effort normal and breath sounds normal. No respiratory distress. He has no wheezes. He has no rales.  Lymphadenopathy:    He has no cervical adenopathy.          Assessment & Plan:

## 2016-02-13 NOTE — Progress Notes (Signed)
Pre visit review using our clinic review tool, if applicable. No additional management support is needed unless otherwise documented below in the visit note. 

## 2017-07-12 DIAGNOSIS — H1089 Other conjunctivitis: Secondary | ICD-10-CM | POA: Diagnosis not present

## 2017-07-12 DIAGNOSIS — B349 Viral infection, unspecified: Secondary | ICD-10-CM | POA: Diagnosis not present

## 2017-07-12 DIAGNOSIS — R030 Elevated blood-pressure reading, without diagnosis of hypertension: Secondary | ICD-10-CM | POA: Diagnosis not present

## 2018-11-09 ENCOUNTER — Other Ambulatory Visit: Payer: Self-pay | Admitting: *Deleted

## 2018-11-09 DIAGNOSIS — Z20822 Contact with and (suspected) exposure to covid-19: Secondary | ICD-10-CM

## 2018-11-10 LAB — NOVEL CORONAVIRUS, NAA: SARS-CoV-2, NAA: NOT DETECTED

## 2019-01-06 DIAGNOSIS — L7211 Pilar cyst: Secondary | ICD-10-CM | POA: Diagnosis not present

## 2019-01-06 DIAGNOSIS — R208 Other disturbances of skin sensation: Secondary | ICD-10-CM | POA: Diagnosis not present

## 2019-01-10 ENCOUNTER — Telehealth: Payer: Self-pay

## 2019-01-10 NOTE — Telephone Encounter (Addendum)
Ok to schedule CPE with me as next appointment.  Since he's been seen within 3 yrs in our office, he is not a new patient. Thanks

## 2019-01-10 NOTE — Telephone Encounter (Signed)
Patient's wife contacted our office wanting to schedule patient for a CPE. This patient was last seen by Dr. Darnell Level in 2013 - but has been seen for acute issues once in 2016 by Dr. Diona Browner, and 2018 by Dr. Silvio Pate.  Dr. Darnell Level - will this patient need to re-establish with you before I can schedule a CPE? Please let me know & I will schedule him appropriately.  Thanks!

## 2019-01-13 NOTE — Telephone Encounter (Signed)
Patient has been scheduled for CPE. Thanks!

## 2019-02-12 ENCOUNTER — Other Ambulatory Visit: Payer: Self-pay | Admitting: Family Medicine

## 2019-02-12 DIAGNOSIS — K760 Fatty (change of) liver, not elsewhere classified: Secondary | ICD-10-CM

## 2019-02-12 DIAGNOSIS — E785 Hyperlipidemia, unspecified: Secondary | ICD-10-CM

## 2019-02-12 DIAGNOSIS — R7309 Other abnormal glucose: Secondary | ICD-10-CM

## 2019-02-13 ENCOUNTER — Other Ambulatory Visit: Payer: Self-pay

## 2019-02-13 ENCOUNTER — Other Ambulatory Visit (INDEPENDENT_AMBULATORY_CARE_PROVIDER_SITE_OTHER): Payer: BC Managed Care – PPO

## 2019-02-13 DIAGNOSIS — R7309 Other abnormal glucose: Secondary | ICD-10-CM | POA: Diagnosis not present

## 2019-02-13 DIAGNOSIS — E785 Hyperlipidemia, unspecified: Secondary | ICD-10-CM | POA: Diagnosis not present

## 2019-02-13 LAB — COMPREHENSIVE METABOLIC PANEL
ALT: 33 U/L (ref 0–53)
AST: 22 U/L (ref 0–37)
Albumin: 4.4 g/dL (ref 3.5–5.2)
Alkaline Phosphatase: 63 U/L (ref 39–117)
BUN: 14 mg/dL (ref 6–23)
CO2: 28 mEq/L (ref 19–32)
Calcium: 9.2 mg/dL (ref 8.4–10.5)
Chloride: 101 mEq/L (ref 96–112)
Creatinine, Ser: 1.2 mg/dL (ref 0.40–1.50)
GFR: 66.92 mL/min (ref >60.00)
Glucose, Bld: 110 mg/dL — ABNORMAL HIGH (ref 70–99)
Potassium: 3.9 mEq/L (ref 3.5–5.1)
Sodium: 136 mEq/L (ref 135–145)
Total Bilirubin: 0.6 mg/dL (ref 0.2–1.2)
Total Protein: 7.4 g/dL (ref 6.0–8.3)

## 2019-02-13 LAB — LIPID PANEL
Cholesterol: 193 mg/dL (ref 0–200)
HDL: 43.1 mg/dL (ref >39.00)
LDL Cholesterol: 128 mg/dL — ABNORMAL HIGH (ref 0–99)
NonHDL: 150.38
Total CHOL/HDL Ratio: 4
Triglycerides: 110 mg/dL (ref 0.0–149.0)
VLDL: 22 mg/dL (ref 0.0–40.0)

## 2019-02-13 LAB — TSH: TSH: 1.83 u[IU]/mL (ref 0.35–4.50)

## 2019-02-13 LAB — HEMOGLOBIN A1C: Hgb A1c MFr Bld: 6.1 % (ref 4.6–6.5)

## 2019-02-20 ENCOUNTER — Other Ambulatory Visit: Payer: Self-pay

## 2019-02-20 ENCOUNTER — Ambulatory Visit (INDEPENDENT_AMBULATORY_CARE_PROVIDER_SITE_OTHER): Payer: BC Managed Care – PPO | Admitting: Family Medicine

## 2019-02-20 ENCOUNTER — Encounter: Payer: Self-pay | Admitting: Family Medicine

## 2019-02-20 VITALS — BP 120/84 | HR 95 | Temp 97.8°F | Ht 69.5 in | Wt 214.1 lb

## 2019-02-20 DIAGNOSIS — M549 Dorsalgia, unspecified: Secondary | ICD-10-CM

## 2019-02-20 DIAGNOSIS — Z0000 Encounter for general adult medical examination without abnormal findings: Secondary | ICD-10-CM

## 2019-02-20 DIAGNOSIS — R0600 Dyspnea, unspecified: Secondary | ICD-10-CM | POA: Diagnosis not present

## 2019-02-20 DIAGNOSIS — Z23 Encounter for immunization: Secondary | ICD-10-CM

## 2019-02-20 DIAGNOSIS — G4733 Obstructive sleep apnea (adult) (pediatric): Secondary | ICD-10-CM | POA: Insufficient documentation

## 2019-02-20 DIAGNOSIS — R7309 Other abnormal glucose: Secondary | ICD-10-CM

## 2019-02-20 DIAGNOSIS — Z3009 Encounter for other general counseling and advice on contraception: Secondary | ICD-10-CM

## 2019-02-20 DIAGNOSIS — E669 Obesity, unspecified: Secondary | ICD-10-CM

## 2019-02-20 DIAGNOSIS — E785 Hyperlipidemia, unspecified: Secondary | ICD-10-CM

## 2019-02-20 DIAGNOSIS — E66811 Obesity, class 1: Secondary | ICD-10-CM

## 2019-02-20 MED ORDER — CETIRIZINE HCL 10 MG PO TABS: 10.0000 mg | ORAL_TABLET | Freq: Every day | ORAL | Status: DC | PRN

## 2019-02-20 MED ORDER — FLUTICASONE PROPIONATE 50 MCG/ACT NA SUSP: 2.0000 | Freq: Every day | NASAL | Status: AC | PRN

## 2019-02-20 NOTE — Progress Notes (Signed)
This visit was conducted in person.  BP 120/84 (BP Location: Left Arm, Patient Position: Sitting, Cuff Size: Large)   Pulse 95   Temp 97.8 F (36.6 C) (Temporal)   Ht 5' 9.5" (1.765 m)   Wt 214 lb 1 oz (97.1 kg)   SpO2 98%   BMI 31.16 kg/m    CC: CPE Subjective:    Patient ID: Alan Barber, male    DOB: 21-Apr-1980, 39 y.o.   MRN: [id number]  HPI: Alan Barber is a 39 y.o. male presenting on 02/20/2019 for Annual Exam   Last seen 02/2016.  Interested in vasectomy - 2 children at home. He and wife are sure they don't want further children.   Worried may be developing sleep apnea - PNdyspnea, waking up with headaches. Noted symptoms over last several years. Non restorative sleep. No significant daytime somnolence. Does snore, + witnessed apneic episodes per wife. Averages 6 hours of sleep, increased sleep doesn't help. Does drink caffeine 16-32 oz /day. Interested in further eval.   Goalkeeper for indoor soccer team. Dives - occasional hard falls. Occasional aches from this. S/p cholecystectomy. Occasional dyspnea - trouble taking full breath noted in the mornings. Some discomfort. This can happen 1-2 times a month, ongoing for last 2 years.   Preventative: Flu shot - declines  Td 2008, Tdap today. Seat belt use discussed. Sunscreen use discussed, no changing moles on skin. Saw derm last month.  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 Interim medical history since our last visit reviewed. Allergies and medications reviewed and updated. Outpatient Medications Prior to Visit  Medication Sig Dispense  Refill  . B Complex Vitamins (VITAMIN B COMPLEX PO) Take by mouth.    . Cholecalciferol (VITAMIN D3 PO) Take by mouth.    Marland Kitchen OVER THE COUNTER MEDICATION Essential oils    . Probiotic Product (PROBIOTIC DAILY PO) Take by mouth daily.    Marland Kitchen oseltamivir (TAMIFLU) 75 MG capsule Take 1 capsule (75 mg total) by mouth 2 (two) times daily. 10 capsule 1   No facility-administered medications prior to visit.     Per HPI unless specifically indicated in ROS section below Review of Systems  Constitutional: Negative for activity change, appetite change, chills, fatigue, fever and unexpected weight change.  HENT: Negative for hearing loss.   Eyes: Negative for visual disturbance.  Respiratory: Positive for shortness of breath (see hpi). Negative for cough, chest tightness and wheezing.   Cardiovascular: Negative for chest pain, palpitations and leg swelling.  Gastrointestinal: Positive for diarrhea (chronic loose stools worse with high fat meals). Negative for abdominal distention, abdominal pain, blood in stool, constipation, nausea and vomiting.  Genitourinary: Negative for difficulty urinating and hematuria.  Musculoskeletal: Negative for arthralgias, myalgias and neck pain.  Skin: Negative for rash.  Neurological: Negative for dizziness, seizures, syncope and headaches.  Hematological: Negative for adenopathy. Does not bruise/bleed easily.  Psychiatric/Behavioral: Negative for dysphoric mood. The patient is not nervous/anxious.    Objective:    BP 120/84 (BP Location: Left Arm, Patient Position: Sitting, Cuff Size: Large)   Pulse 95   Temp 97.8 F (36.6 C) (Temporal)  Ht 5' 9.5" (1.765 m)   Wt 214 lb 1 oz (97.1 kg)   SpO2 98%   BMI 31.16 kg/m   Wt Readings from Last 3 Encounters:  02/20/19 214 lb 1 oz (97.1 kg)  02/13/16 214 lb (97.1 kg)  08/21/14 207 lb 12 oz (94.2 kg)    Physical Exam Vitals and nursing note reviewed.  Constitutional:      General: He is not in acute distress.     Appearance: Normal appearance. He is well-developed. He is not ill-appearing.  HENT:     Head: Normocephalic and atraumatic.     Right Ear: Hearing, tympanic membrane, ear canal and external ear normal.     Left Ear: Hearing, tympanic membrane, ear canal and external ear normal.     Mouth/Throat:     Pharynx: Uvula midline.  Eyes:     General: No scleral icterus.    Extraocular Movements: Extraocular movements intact.     Conjunctiva/sclera: Conjunctivae normal.     Pupils: Pupils are equal, round, and reactive to light.  Cardiovascular:     Rate and Rhythm: Normal rate and regular rhythm.     Pulses: Normal pulses.          Radial pulses are 2+ on the right side and 2+ on the left side.     Heart sounds: Normal heart sounds. No murmur.  Pulmonary:     Effort: Pulmonary effort is normal. No respiratory distress.     Breath sounds: Normal breath sounds. No wheezing, rhonchi or rales.  Abdominal:     General: Abdomen is flat. Bowel sounds are normal. There is no distension.     Palpations: Abdomen is soft. There is no mass.     Tenderness: There is no abdominal tenderness. There is no guarding or rebound.     Hernia: No hernia is present.  Musculoskeletal:        General: Tenderness present. Normal range of motion.       Arms:     Cervical back: Normal range of motion and neck supple.     Right lower leg: No edema.     Left lower leg: No edema.     Comments:  No midline thoracic spine pain  Reproducible tenderness to palpation R mid back  Lymphadenopathy:     Cervical: No cervical adenopathy.  Skin:    General: Skin is warm and dry.     Findings: No rash.  Neurological:     General: No focal deficit present.     Mental Status: He is alert and oriented to person, place, and time.     Comments: CN grossly intact, station and gait intact  Psychiatric:        Mood and Affect: Mood normal.        Behavior: Behavior normal.        Thought Content: Thought content normal.         Judgment: Judgment normal.       Results for orders placed or performed in visit on 02/13/19  Hemoglobin A1c  Result Value Ref Range   Hgb A1c MFr Bld 6.1 4.6 - 6.5 %  TSH  Result Value Ref Range   TSH 1.83 0.35 - 4.50 uIU/mL  Comprehensive metabolic panel  Result Value Ref Range   Sodium 136 135 - 145 mEq/L   Potassium 3.9 3.5 - 5.1 mEq/L   Chloride 101 96 - 112 mEq/L   CO2 28 19 - 32 mEq/L   Glucose,  Bld 110 (H) 70 - 99 mg/dL   BUN 14 6 - 23 mg/dL   Creatinine, Ser 1.20 0.40 - 1.50 mg/dL   Total Bilirubin 0.6 0.2 - 1.2 mg/dL   Alkaline Phosphatase 63 39 - 117 U/L   AST 22 0 - 37 U/L   ALT 33 0 - 53 U/L   Total Protein 7.4 6.0 - 8.3 g/dL   Albumin 4.4 3.5 - 5.2 g/dL   GFR 66.92 >60.00 mL/min   Calcium 9.2 8.4 - 10.5 mg/dL  Lipid panel  Result Value Ref Range   Cholesterol 193 0 - 200 mg/dL   Triglycerides 110.0 0.0 - 149.0 mg/dL   HDL 43.10 >39.00 mg/dL   VLDL 22.0 0.0 - 40.0 mg/dL   LDL Cholesterol 128 (H) 0 - 99 mg/dL   Total CHOL/HDL Ratio 4    NonHDL 150.38    Assessment & Plan:  This visit occurred during the SARS-CoV-2 public health emergency.  Safety protocols were in place, including screening questions prior to the visit, additional usage of staff PPE, and extensive cleaning of exam room while observing appropriate contact time as indicated for disinfecting solutions.   Problem List Items Addressed This Visit    Upper back pain on right side    Positional, reproducible - anticipate MSK discomfort present. Discussed supportive care with heating pad, gentle stretching, trial voltaren gel, f/u with Dr Lorelei Pont if worsening. Doubt liver or biliary etiology.       PREDIABETES    Reviewed A1c, reviewed limiting added sugars and sweetened beverages.      Paroxysmal nocturnal dyspnea    ESS = 8 Describes several symptoms of OSA - will refer to pulm for further evaluation.       Relevant Orders   Ambulatory referral to Pulmonology   Obesity, Class I, BMI  30.0-34.9 (see actual BMI)    Encouraged healthy diet and lifestyle changes to affect sustainable weight loss.      Healthcare maintenance - Primary    Preventative protocols reviewed and updated unless pt declined. Discussed healthy diet and lifestyle.       Dyslipidemia    Chronic, off medication. Reviewed healthy diet choices to maintain cholesterol control.  The ASCVD Risk score Mikey Bussing DC Brooke Bonito., et al., 2013) failed to calculate for the following reasons:   Unable to determine if patient is Non-Hispanic African American        Other Visit Diagnoses    Vasectomy evaluation       Relevant Orders   Ambulatory referral to Urology   Need for Tdap vaccination       Relevant Orders   Tdap vaccine greater than or equal to 7yo IM (Completed)       Meds ordered this encounter  Medications  . fluticasone (FLONASE) 50 MCG/ACT nasal spray    Sig: Place 2 sprays into both nostrils daily as needed for allergies or rhinitis.  Marland Kitchen cetirizine (ZYRTEC) 10 MG tablet    Sig: Take 1 tablet (10 mg total) by mouth daily as needed for allergies.   Orders Placed This Encounter  Procedures  . Tdap vaccine greater than or equal to 7yo IM  . Ambulatory referral to Urology    Referral Priority:   Routine    Referral Type:   Consultation    Referral Reason:   Specialty Services Required    Requested Specialty:   Urology    Number of Visits Requested:   1  . Ambulatory referral to Pulmonology  Referral Priority:   Routine    Referral Type:   Consultation    Referral Reason:   Specialty Services Required    Requested Specialty:   Pulmonary Disease    Number of Visits Requested:   1    Patient instructions: We will refer you to pulmonology for further evaluation of sleep apnea.  We will refer you to urology for discussion on vasectomy.  Back pain is musculoskeletal - continue gentle stretching, heating pad, consider voltaren gel, and schedule appointment with Dr Lorelei Pont if progressing.  You are  doing well today Work on limiting sugar in the diet.  Return as needed or in 1 year for physical.   Follow up plan: Return in about 1 year (around 02/20/2020) for annual exam, prior fasting for blood work.  Ria Bush, MD

## 2019-02-20 NOTE — Assessment & Plan Note (Signed)
ESS = 8 Describes several symptoms of OSA - will refer to pulm for further evaluation.

## 2019-02-20 NOTE — Patient Instructions (Addendum)
We will refer you to pulmonology for further evaluation of sleep apnea.  We will refer you to urology for discussion on vasectomy.  Back pain is musculoskeletal - continue gentle stretching, heating pad, consider voltaren gel, and schedule appointment with Dr Lorelei Pont if progressing.  You are doing well today Work on limiting sugar in the diet.  Return as needed or in 1 year for physical.   Health Maintenance, Male Adopting a healthy lifestyle and getting preventive care are important in promoting health and wellness. Ask your health care provider about:  The right schedule for you to have regular tests and exams.  Things you can do on your own to prevent diseases and keep yourself healthy. What should I know about diet, weight, and exercise? Eat a healthy diet   Eat a diet that includes plenty of vegetables, fruits, low-fat dairy products, and lean protein.  Do not eat a lot of foods that are high in solid fats, added sugars, or sodium. Maintain a healthy weight Body mass index (BMI) is a measurement that can be used to identify possible weight problems. It estimates body fat based on height and weight. Your health care provider can help determine your BMI and help you achieve or maintain a healthy weight. Get regular exercise Get regular exercise. This is one of the most important things you can do for your health. Most adults should:  Exercise for at least 150 minutes each week. The exercise should increase your heart rate and make you sweat (moderate-intensity exercise).  Do strengthening exercises at least twice a week. This is in addition to the moderate-intensity exercise.  Spend less time sitting. Even light physical activity can be beneficial. Watch cholesterol and blood lipids Have your blood tested for lipids and cholesterol at 41 years of age, then have this test every 5 years. You may need to have your cholesterol levels checked more often if:  Your lipid or cholesterol  levels are high.  You are older than 41 years of age.  You are at high risk for heart disease. What should I know about cancer screening? Many types of cancers can be detected early and may often be prevented. Depending on your health history and family history, you may need to have cancer screening at various ages. This may include screening for:  Colorectal cancer.  Prostate cancer.  Skin cancer.  Lung cancer. What should I know about heart disease, diabetes, and high blood pressure? Blood pressure and heart disease  High blood pressure causes heart disease and increases the risk of stroke. This is more likely to develop in people who have high blood pressure readings, are of African descent, or are overweight.  Talk with your health care provider about your target blood pressure readings.  Have your blood pressure checked: ? Every 3-5 years if you are 58-30 years of age. ? Every year if you are 72 years old or older.  If you are between the ages of 72 and 60 and are a current or former smoker, ask your health care provider if you should have a one-time screening for abdominal aortic aneurysm (AAA). Diabetes Have regular diabetes screenings. This checks your fasting blood sugar level. Have the screening done:  Once every three years after age 63 if you are at a normal weight and have a low risk for diabetes.  More often and at a younger age if you are overweight or have a high risk for diabetes. What should I know about preventing infection?  Hepatitis B If you have a higher risk for hepatitis B, you should be screened for this virus. Talk with your health care provider to find out if you are at risk for hepatitis B infection. Hepatitis C Blood testing is recommended for:  Everyone born from [birth year] through 1967.  Anyone with known risk factors for hepatitis C. Sexually transmitted infections (STIs)  You should be screened each year for STIs, including gonorrhea and  chlamydia, if: ? You are sexually active and are younger than 41 years of age. ? You are older than 41 years of age and your health care provider tells you that you are at risk for this type of infection. ? Your sexual activity has changed since you were last screened, and you are at increased risk for chlamydia or gonorrhea. Ask your health care provider if you are at risk.  Ask your health care provider about whether you are at high risk for HIV. Your health care provider may recommend a prescription medicine to help prevent HIV infection. If you choose to take medicine to prevent HIV, you should first get tested for HIV. You should then be tested every 3 months for as long as you are taking the medicine. Follow these instructions at home: Lifestyle  Do not use any products that contain nicotine or tobacco, such as cigarettes, e-cigarettes, and chewing tobacco. If you need help quitting, ask your health care provider.  Do not use street drugs.  Do not share needles.  Ask your health care provider for help if you need support or information about quitting drugs. Alcohol use  Do not drink alcohol if your health care provider tells you not to drink.  If you drink alcohol: ? Limit how much you have to 0-2 drinks a day. ? Be aware of how much alcohol is in your drink. In the [place] one drink equals one 12 oz bottle of beer (355 mL), one 5 oz glass of wine (148 mL), or one 1 oz glass of hard liquor (44 mL). General instructions  Schedule regular health, dental, and eye exams.  Stay current with your vaccines.  Tell your health care provider if: ? You often feel depressed. ? You have ever been abused or do not feel safe at home. Summary  Adopting a healthy lifestyle and getting preventive care are important in promoting health and wellness.  Follow your health care provider's instructions about healthy diet, exercising, and getting tested or screened for diseases.  Follow your health  care provider's instructions on monitoring your cholesterol and blood pressure. This information is not intended to replace advice given to you by your health care provider. Make sure you discuss any questions you have with your health care provider. Document Revised: 01/12/2018 Document Reviewed: 01/12/2018 Elsevier Patient Education  2020 [employer].

## 2019-02-20 NOTE — Assessment & Plan Note (Signed)
Preventative protocols reviewed and updated unless pt declined. Discussed healthy diet and lifestyle.  

## 2019-02-20 NOTE — Assessment & Plan Note (Addendum)
Chronic, off medication. Reviewed healthy diet choices to maintain cholesterol control.  The ASCVD Risk score Mikey Bussing DC Jr., et al., 2013) failed to calculate for the following reasons:   Unable to determine if patient is Non-Hispanic African American

## 2019-02-20 NOTE — Assessment & Plan Note (Signed)
Reviewed A1c, reviewed limiting added sugars and sweetened beverages.

## 2019-02-20 NOTE — Assessment & Plan Note (Signed)
Positional, reproducible - anticipate MSK discomfort present. Discussed supportive care with heating pad, gentle stretching, trial voltaren gel, f/u with Dr Lorelei Pont if worsening. Doubt liver or biliary etiology.

## 2019-02-20 NOTE — Assessment & Plan Note (Signed)
Encouraged healthy diet and lifestyle changes to affect sustainable weight loss.  

## 2019-03-06 ENCOUNTER — Ambulatory Visit (INDEPENDENT_AMBULATORY_CARE_PROVIDER_SITE_OTHER): Payer: BC Managed Care – PPO | Admitting: Emergency Medicine

## 2019-03-06 ENCOUNTER — Encounter: Payer: Self-pay | Admitting: Emergency Medicine

## 2019-03-06 ENCOUNTER — Other Ambulatory Visit: Payer: Self-pay

## 2019-03-06 VITALS — BP 122/76 | HR 74 | Ht 70.0 in | Wt 218.0 lb

## 2019-03-06 DIAGNOSIS — R0681 Apnea, not elsewhere classified: Secondary | ICD-10-CM

## 2019-03-06 DIAGNOSIS — R0600 Dyspnea, unspecified: Secondary | ICD-10-CM | POA: Diagnosis not present

## 2019-03-06 DIAGNOSIS — R0683 Snoring: Secondary | ICD-10-CM | POA: Diagnosis not present

## 2019-03-06 NOTE — Progress Notes (Signed)
Subjective:    Patient ID: Alan Barber, male    DOB: Jun 06, 1980, 39 y.o.   MRN: [id number]  HPI 39 year old never smoker with a history of seasonal allergies, GERD, hyperlipidemia with fatty liver disease, tension headaches.  He is referred today by Dr. Danise Mina for evaluation of nocturnal dyspnea and suspicion for possible obstructive sleep apnea.  He has a lot of snoring, wife has noticed periods of gasping while sleeping, can wake him up occasionally. He has morning HA's. doesn' t feel well rested in the am. 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 He can fall asleep after a meal, watching TV or a movie.  Does not fall asleep while driving   Sleep Questionnaire  1. What time do you typically go to bed? 0:00-1:00 2. How long does it take you to fall asleep? minutes 3. How many times during the night do you wake up? 2 4. What time do you get out of the bed to start your day? 0700 5. Do you drive or operate heavy machinery in your occupation? No 6. How much as your weight changed (up or down) in the past 2 years? Up about 10 lbs.  7. Have you ever had a sleep study done before? No  If yes, when and where? NA 8. Do you currently wear a CPAP machine? No  If so, at what pressure? NA 9. Do you wear oxygen at any time? No  If so, how many liters per minute? NA   Results of the Epworth flowsheet 03/06/2019  Sitting and reading 2  Watching TV 2  Sitting, inactive in a public place (e.g. a theatre or a meeting) 0  As a passenger in a car for an hour without a break 0  Lying down to rest in the afternoon when circumstances permit 2  Sitting and talking to someone 0  Sitting quietly after a lunch without alcohol 0  In a car, while stopped for a few minutes in traffic 0  Total score 6     Review of Systems As detailed above in HPI  Past Medical History:  Diagnosis Date  . GERD (gastroesophageal reflux disease)   . HLD (hyperlipidemia)   . Mastocytosis   . Nonalcoholic fatty liver  disease    has seen GI (Dr. Justus Memory, [place])  . Seasonal allergies   . Tension headache      Family History  Problem Relation Age of Onset  . Diabetes type II Mother   . Hypertension Father   . Breast cancer Paternal Grandmother   . Kidney disease Maternal Grandfather   . Cancer Paternal Grandfather        skin (?melanoma)  . Heart disease Maternal Grandmother      Social History   Socioeconomic History  . Marital status: Married    Spouse name: Not on file  . Number of children: Not on file  . Years of education: Not on file  . Highest education level: Not on file  Occupational History  . Occupation: PT    Comment: [employer]  . Smoking status: Never Smoker  . Smokeless tobacco: Never Used  Substance and Sexual Activity  . Alcohol use: No    Alcohol/week: 0.0 standard drinks  . Drug use: No  . Sexual activity: Not on file  Other Topics Concern  . Not on file  Social History Narrative   caffeine: 16oz soda/day   Occupation: physical therapist, [employer]   Lives with  wife and daughter (2009), 1 dog, 1 cat   Edu: Grad school, doctor of PT   Activity: lifts weights, some running   Diet: good water, fruits/vegetables daily   Social Determinants of Health   Financial Resource Strain:   . Difficulty of Paying Living Expenses: Not on file  Food Insecurity:   . Worried About Charity fundraiser in the Last Year: Not on file  . Ran Out of Food in the Last Year: Not on file  Transportation Needs:   . Lack of Transportation (Medical): Not on file  . Lack of Transportation (Non-Medical): Not on file  Physical Activity:   . Days of Exercise per Week: Not on file  . Minutes of Exercise per Session: Not on file  Stress:   . Feeling of Stress : Not on file  Social Connections:   . Frequency of Communication with Friends and Family: Not on file  . Frequency of Social Gatherings with Friends and Family: Not on file  . Attends Religious Services:  Not on file  . Active Member of Clubs or Organizations: Not on file  . Attends Archivist Meetings: Not on file  . Marital Status: Not on file  Intimate Partner Violence:   . Fear of Current or Ex-Partner: Not on file  . Emotionally Abused: Not on file  . Physically Abused: Not on file  . Sexually Abused: Not on file     No Known Allergies   Outpatient Medications Prior to Visit  Medication Sig Dispense Refill  . B Complex Vitamins (VITAMIN B COMPLEX PO) Take by mouth.    . cetirizine (ZYRTEC) 10 MG tablet Take 1 tablet (10 mg total) by mouth daily as needed for allergies.    . Cholecalciferol (VITAMIN D3 PO) Take by mouth.    . fluticasone (FLONASE) 50 MCG/ACT nasal spray Place 2 sprays into both nostrils daily as needed for allergies or rhinitis.    Marland Kitchen OVER THE COUNTER MEDICATION Essential oils    . Probiotic Product (PROBIOTIC DAILY PO) Take by mouth daily.     No facility-administered medications prior to visit.        Objective:   Physical Exam Vitals:   03/06/19 1424  BP: 122/76  Pulse: 74  SpO2: 99%  Weight: 218 lb (98.9 kg)  Height: 5\' 10"  (1.778 m)    Gen: Pleasant, well-nourished, in no distress,  normal affect  ENT: No lesions,  mouth clear, somewhat narrow posterior pharynx, still has his tonsils, large uvula  Neck: No JVD, no stridor  Lungs: No use of accessory muscles, no crackles or wheezing on normal respiration, no wheeze on forced expiration  Cardiovascular: RRR, heart sounds normal, no murmur or gallops, no peripheral edema  Musculoskeletal: No deformities, no cyanosis or clubbing  Neuro: alert, awake, non focal  Skin: Warm, no lesions or rash      Assessment & Plan:  Paroxysmal nocturnal dyspnea He has multiple symptoms that would be consistent with obstructive sleep apnea.  Snoring, episodes it sounds like witnessed apneas.  I think he needs a formal sleep study for diagnosis of OSA if present.  He is willing to try CPAP if  indicated and I will order for him depending on the split-night sleep study results.  Plan to follow-up with him after CPAP initiated to assess compliance, clinical benefit, etc.   Baltazar Apo, MD, PhD 03/06/2019, 2:50 PM [facility] [place] [phone] or if no answer [phone]

## 2019-03-06 NOTE — Patient Instructions (Signed)
We will arrange for sleep study to assess for obstructive sleep apnea.  If present then we can arrange for CPAP therapy. We will discuss the results of your study by phone and order CPAP if indicated Follow-up with Dr. Lamonte Sakai in 2 to 3 months to assess your status on CPAP treatment

## 2019-03-06 NOTE — Assessment & Plan Note (Signed)
He has multiple symptoms that would be consistent with obstructive sleep apnea.  Snoring, episodes it sounds like witnessed apneas.  I think he needs a formal sleep study for diagnosis of OSA if present.  He is willing to try CPAP if indicated and I will order for him depending on the split-night sleep study results.  Plan to follow-up with him after CPAP initiated to assess compliance, clinical benefit, etc.

## 2019-03-14 ENCOUNTER — Other Ambulatory Visit (HOSPITAL_COMMUNITY): Payer: BC Managed Care – PPO

## 2019-03-14 ENCOUNTER — Other Ambulatory Visit: Payer: Self-pay | Admitting: *Deleted

## 2019-03-14 DIAGNOSIS — R0683 Snoring: Secondary | ICD-10-CM

## 2019-03-14 DIAGNOSIS — R0681 Apnea, not elsewhere classified: Secondary | ICD-10-CM

## 2019-03-16 ENCOUNTER — Other Ambulatory Visit: Payer: BC Managed Care – PPO

## 2019-03-17 ENCOUNTER — Encounter (HOSPITAL_BASED_OUTPATIENT_CLINIC_OR_DEPARTMENT_OTHER): Payer: BC Managed Care – PPO | Admitting: Pulmonary Disease

## 2019-03-18 ENCOUNTER — Encounter (HOSPITAL_BASED_OUTPATIENT_CLINIC_OR_DEPARTMENT_OTHER): Payer: BC Managed Care – PPO | Admitting: Pulmonary Disease

## 2019-03-22 ENCOUNTER — Encounter: Payer: Self-pay | Admitting: Urology

## 2019-03-22 ENCOUNTER — Other Ambulatory Visit: Payer: Self-pay

## 2019-03-22 ENCOUNTER — Ambulatory Visit (INDEPENDENT_AMBULATORY_CARE_PROVIDER_SITE_OTHER): Payer: BC Managed Care – PPO | Admitting: Urology

## 2019-03-22 VITALS — BP 164/90 | HR 93 | Ht 70.0 in | Wt 205.0 lb

## 2019-03-22 DIAGNOSIS — Z3009 Encounter for other general counseling and advice on contraception: Secondary | ICD-10-CM

## 2019-03-22 MED ORDER — DIAZEPAM 5 MG PO TABS: 5.0000 mg | ORAL_TABLET | Freq: Once | ORAL | 0 refills | Status: DC | PRN

## 2019-03-22 NOTE — Patient Instructions (Addendum)
Pre-Vasectomy Instructions  STOP all aspirin or blood thinners (Aspirin, Plavix, Coumadin, Warfarin, Motrin, Ibuprofen, Advil, Aleve, Naproxen, Naprosyn) for 7 days prior to the procedure.  If you have any questions about stopping these medications please contact your primary care physician or cardiologist.  Shave all hair from the upper scrotum on the day of the procedure.  This means just under the penis onto the scrotal sac.  The area shaved should measure about 2-3 inches around.  You may lather the scrotum with soap and water, and shave with a safety razor.  After shaving the area, thoroughly wash the penis and the scrotum, then shower or bathe to remove all the loose hairs.  If needed, wash the area again just before coming in for your circumcision.  It is recommended to have a light meal an hour or so prior to the procedure.  Bring a scrotal support (jock strap or suspensory, or tight jockey shorts or underwear).  Wear comfortable pants or shorts.  While the actual procedure usually takes about 45 minutes, you should be prepared to stay in the office for approximately one hour.  Bring someone with you to drive you home.  If you have any questions or concerns, please feel free to call the office at [phone].     Vasectomy, Care After This sheet gives you information about how to care for yourself after your procedure. Your health care provider may also give you more specific instructions. If you have problems or questions, contact your health care provider. What can I expect after the procedure? After your procedure, it is common to have:  Mild pain, swelling, redness, or discomfort in your scrotum.  Some blood coming from your incisions or puncture sites for one or two days.  Blood in your semen. Follow these instructions at home: Medicines   Take over-the-counter and prescription medicines only as told by your health care provider.  Avoid taking NSAIDs such as aspirin  and ibuprofen, because these medicines can make bleeding worse. Activity  For the first 2 days after surgery, avoid physical activity and exercise that require a lot of energy. Ask your health care provider what activities are safe for you.  Do not participate in sports or perform heavy physical labor until your pain has improved, or until your health care provider says it is okay.  Do not ejaculate for at least 1 week after the procedure, or as long as directed.  You may resume sexual activity 7-10 days after your procedure, or when your health care provider approves. Use a different method of birth control (contraception) until you have had test results that confirm that there is no sperm in your semen. Scrotal support  Use scrotal support, such as a jock strap or underwear with a supportive pouch, as needed for one week after your procedure.  If you feel discomfort in your scrotum, you may remove the scrotal support to see if the discomfort is relieved. Sometimes scrotal support can press on the scrotum and cause or worsen discomfort.  If your skin gets irritated, you may add some germ-free (sterile), fluffed bandages or a clean washcloth to the scrotal support. General instructions  Put ice on the injured area: ? Put ice in a plastic bag. ? Place a towel between your skin and the bag. ? Leave the ice on for 20 minutes, 2-3 times a day.  Check your incisions or puncture sites every day for signs of infection. Check for: ? Redness, swelling, or pain. ?  Fluid or blood. ? Warmth. ? Pus or a bad smell.  Leave stitches (sutures) in place. The sutures will dissolve on their own and do not need to be removed.  Keep all follow-up visits as told by your health care provider. This is important because you will need a test to confirm that there is no sperm in your semen. Multiple ejaculations are needed to clear out sperm that were beyond the vasectomy site. You will need one test result  showing that there is no sperm in your semen before you can resume unprotected sex. This may take 2-4 months after your procedure.  Do not drive for 24 hours if you were given a sedative to help you relax. Contact a health care provider if:  You have redness, swelling, or more pain around your incision or puncture site, or in your scrotum area in general.  You have bleeding from your incision or puncture site.  You have pus or a bad smell coming from your incision or puncture site.  You have a fever.  Your incision or puncture site opens up. Get help right away if:  You develop a rash.  You have difficulty breathing. Summary  After your procedure it is common to have mild pain, swelling, redness, or discomfort in your scrotum.  Avoid physical activity and exercise that requires a lot of energy for the first 2 days after surgery.  Put ice on the injured area. Leave the ice on for 20 minutes, 2-3 times a day.  Do not drive for 24 hours if you were given a sedative to help you relax. This information is not intended to replace advice given to you by your health care provider. Make sure you discuss any questions you have with your health care provider. Document Revised: 01/01/2017 Document Reviewed: 04/17/2016 Elsevier Patient Education  [place].

## 2019-03-22 NOTE — Progress Notes (Signed)
03/22/19 3:19 PM   Alan Barber 1981/01/05 [id number]  CC: Discuss vasectomy  HPI: I saw Mr. Shotts in urology clinic for discussion of vasectomy.  He is a healthy 39 year old male with 2 daughters ages 32 and 68, and he and his partner do not desire any further biologic children.  He works as a Community education officer.    PMH: Past Medical History:  Diagnosis Date  . GERD (gastroesophageal reflux disease)   . HLD (hyperlipidemia)   . Mastocytosis   . Nonalcoholic fatty liver disease    has seen GI (Dr. Justus Memory, [place])  . Seasonal allergies   . Tension headache     Surgical History: Past Surgical History:  Procedure Laterality Date  . abd ultrasound  04/2008   small gallbladder polyps, mild fatty infiltration of liver  . LAPAROSCOPIC CHOLECYSTECTOMY  11/2011   gallstones Tamala Julian)    Family History: Family History  Problem Relation Age of Onset  . Diabetes type II Mother   . Hypertension Father   . Breast cancer Paternal Grandmother   . Kidney disease Maternal Grandfather   . Cancer Paternal Grandfather        skin (?melanoma)  . Heart disease Maternal Grandmother     Social History:  reports that he has never smoked. He has never used smokeless tobacco. He reports that he does not drink alcohol or use drugs.  Physical Exam: BP (!) 164/90 (BP Location: Left Arm, Patient Position: Sitting, Cuff Size: Normal)   Pulse 93   Ht 5\' 10"  (1.778 m)   Wt 205 lb (93 kg)   BMI 29.41 kg/m    Constitutional:  Alert and oriented, No acute distress. Cardiovascular: No clubbing, cyanosis, or edema. Respiratory: Normal respiratory effort, no increased work of breathing. GI: Abdomen is soft, nontender, nondistended, no abdominal masses GU: Testicles descended and 25cc bilaterally, vas deferens easily palpable, no testicular masses Lymph: No cervical or inguinal lymphadenopathy. Skin: No rashes, bruises or suspicious lesions. Neurologic: Grossly intact, no focal deficits, moving  all 4 extremities. Psychiatric: Normal mood and affect.  Assessment & Plan:   In summary, he is a healthy 39 year old male who desires vasectomy for permanent sterilization.  We discussed the risks and benefits of vasectomy at length.  Vasectomy is intended to be a permanent form of contraception, and does not produce immediate sterility.  Following vasectomy another form of contraception is required until vas occlusion is confirmed by a post-vasectomy semen analysis obtained 2-3 months after the procedure.  Even after vas occlusion is confirmed, vasectomy is not 100% reliable in preventing pregnancy, and the failure rate is approximately 02/1998.  Repeat vasectomy is required in less than 1% of patients.  He should refrain from ejaculation for 1 week after vasectomy.  Options for fertility after vasectomy include vasectomy reversal, and sperm retrieval with in vitro fertilization or ICSI.  These options are not always successful and may be expensive.  Finally, there are other permanent and non-permanent alternatives to vasectomy available. There is no risk of erectile dysfunction, and the volume of semen will be similar to prior, as the majority of the ejaculate is from the prostate and seminal vesicles.   The procedure takes ~20 minutes.  We recommend patients take 5-10 mg of Valium 30 minutes prior, and he will need a driver post-procedure.  Local anesthetic is injected into the scrotal skin and a small segment of the vas deferens is removed, and the ends occluded. The complication rate is approximately 1-2%, and includes bleeding,  infection, and development of chronic scrotal pain.  PLAN: Pending insurance approval, will schedule for vasectomy at his convenience   I spent 45 total minutes on the day of the encounter including pre-visit review of the medical record, face-to-face time with the patient, and post visit ordering of labs/imaging/tests.  Nickolas Madrid, MD 03/22/2019  [facility]  [facility] [address], [place], [place] [address] [phone]

## 2019-03-23 ENCOUNTER — Other Ambulatory Visit: Payer: Self-pay | Admitting: Urology

## 2019-03-23 MED ORDER — DIAZEPAM 5 MG PO TABS: 5.0000 mg | ORAL_TABLET | Freq: Once | ORAL | 0 refills | Status: DC | PRN

## 2019-04-03 ENCOUNTER — Other Ambulatory Visit: Payer: Self-pay

## 2019-04-03 ENCOUNTER — Ambulatory Visit: Payer: BC Managed Care – PPO

## 2019-04-03 DIAGNOSIS — R0683 Snoring: Secondary | ICD-10-CM

## 2019-04-03 DIAGNOSIS — R0681 Apnea, not elsewhere classified: Secondary | ICD-10-CM

## 2019-04-04 DIAGNOSIS — G4733 Obstructive sleep apnea (adult) (pediatric): Secondary | ICD-10-CM

## 2019-04-07 DIAGNOSIS — G4733 Obstructive sleep apnea (adult) (pediatric): Secondary | ICD-10-CM

## 2019-04-26 ENCOUNTER — Encounter: Payer: Self-pay | Admitting: Urology

## 2019-05-11 ENCOUNTER — Encounter: Payer: Self-pay | Admitting: Urology

## 2019-05-22 DIAGNOSIS — G4733 Obstructive sleep apnea (adult) (pediatric): Secondary | ICD-10-CM

## 2019-05-23 NOTE — Telephone Encounter (Signed)
Dr. Lamonte Sakai,   Patient sent you a message this morning requesting HST results.  Message below-  Have you received the results of my in-home sleep study from 04/2019?

## 2019-05-24 NOTE — Telephone Encounter (Signed)
Thanks, I reviewed his Home PSG >> confirmed severe OSA. Based on this I recommend that he start CPAP therapy if he is willing.   If so, please order CPAP auto-set range 5-20 cmH2O, with heated humidity. Best fit mask, would try nasal pillows first to see if he can use this.   Make sure he has ROV with me 30-60 days after he starts the CPAP so we can confirm compliance. Thanks.

## 2019-06-03 DIAGNOSIS — U071 COVID-19: Secondary | ICD-10-CM

## 2019-06-03 HISTORY — DX: COVID-19: U07.1

## 2019-06-04 DIAGNOSIS — Z20828 Contact with and (suspected) exposure to other viral communicable diseases: Secondary | ICD-10-CM | POA: Diagnosis not present

## 2019-06-04 DIAGNOSIS — Z03818 Encounter for observation for suspected exposure to other biological agents ruled out: Secondary | ICD-10-CM | POA: Diagnosis not present

## 2019-06-07 ENCOUNTER — Ambulatory Visit: Payer: BC Managed Care – PPO | Attending: Internal Medicine

## 2019-06-07 DIAGNOSIS — Z20822 Contact with and (suspected) exposure to covid-19: Secondary | ICD-10-CM

## 2019-06-08 LAB — NOVEL CORONAVIRUS, NAA: SARS-CoV-2, NAA: NOT DETECTED

## 2019-06-08 LAB — SARS-COV-2, NAA 2 DAY TAT

## 2019-06-09 ENCOUNTER — Ambulatory Visit: Payer: BC Managed Care – PPO | Attending: Internal Medicine

## 2019-06-09 DIAGNOSIS — Z20822 Contact with and (suspected) exposure to covid-19: Secondary | ICD-10-CM | POA: Diagnosis not present

## 2019-06-10 LAB — SARS-COV-2, NAA 2 DAY TAT

## 2019-06-10 LAB — NOVEL CORONAVIRUS, NAA: SARS-CoV-2, NAA: DETECTED — AB

## 2019-06-13 ENCOUNTER — Telehealth: Payer: Self-pay | Admitting: Family Medicine

## 2019-06-13 NOTE — Telephone Encounter (Signed)
Patient tested positive for covid on 06/09/2019. plz call - when was first day of symptoms? How are symptoms currently?  plz place on covid call list.  Has he had covid vaccines? Would offer monoclonal antibody treatment if within 10 days from symptom onset. If interested, please sign him up through mAb infusion clinic #.

## 2019-06-13 NOTE — Telephone Encounter (Signed)
-----   Message from [place], [place] sent at 06/11/2019  7:55 PM EDT ----- Alan Barber with patient he stated his symptoms are mild, denied SOB, he stated he has had a low fever since Wednesday however it was controlled with otc meds. Patient was advise if his symptoms became worse he needed to seek immediate attention.

## 2019-06-14 NOTE — Telephone Encounter (Signed)
Spoke asking about sxs.  States they started 06/08/19.  Tested positive 06/09/19.  Says today is the first day he has felt better.  No more fever.  Still has some fatigue, yet feels he is "on the up swing".  Offered antibody infusion, pt declined due to improving without.  Pt has not had COVID vaccine.  FYI to Dr. Darnell Level.

## 2019-06-14 NOTE — Telephone Encounter (Addendum)
Pt added to Call Log.  Lvm asking pt to call back.  Need to get answers to Dr. Darnell Level questions.

## 2019-06-14 NOTE — Telephone Encounter (Signed)
Noted. plz call Friday for update on symptoms. If continued improvement, ok to stop following.  rec home quarantine through Sunday May 16th.

## 2019-06-15 ENCOUNTER — Encounter: Payer: Self-pay | Admitting: Urology

## 2019-06-16 ENCOUNTER — Encounter: Payer: Self-pay | Admitting: Family Medicine

## 2019-06-16 NOTE — Telephone Encounter (Signed)
Lvm asking pt to call back.  Need update on COVID sxs and relay Dr. Synthia Innocent message.

## 2019-06-16 NOTE — Telephone Encounter (Signed)
Glad to hear.  Thank you 

## 2019-06-16 NOTE — Telephone Encounter (Signed)
Patient contacted the office back. Patient states he is overall feeling much better. He states he is not having any sxs. He states he did have a low grade fever of 99 last night, but he did not take any OTC meds to control this, and he woke up this morning with a temp of 98.1, so this has resolved. Patient states other than that, he feels great.

## 2019-06-28 DIAGNOSIS — G4733 Obstructive sleep apnea (adult) (pediatric): Secondary | ICD-10-CM | POA: Diagnosis not present

## 2019-07-06 ENCOUNTER — Other Ambulatory Visit: Payer: Self-pay | Admitting: *Deleted

## 2019-07-06 ENCOUNTER — Other Ambulatory Visit: Payer: Self-pay

## 2019-07-06 ENCOUNTER — Ambulatory Visit (INDEPENDENT_AMBULATORY_CARE_PROVIDER_SITE_OTHER): Payer: BC Managed Care – PPO | Admitting: Urology

## 2019-07-06 ENCOUNTER — Encounter: Payer: Self-pay | Admitting: Urology

## 2019-07-06 VITALS — BP 143/84 | HR 80 | Ht 70.0 in | Wt 205.0 lb

## 2019-07-06 DIAGNOSIS — Z302 Encounter for sterilization: Secondary | ICD-10-CM

## 2019-07-06 HISTORY — PX: VASECTOMY: SHX75

## 2019-07-06 NOTE — Patient Instructions (Signed)
Vasectomy, Care After This sheet gives you information about how to care for yourself after your procedure. Your health care provider may also give you more specific instructions. If you have problems or questions, contact your health care provider. What can I expect after the procedure? After your procedure, it is common to have:  Mild pain, swelling, redness, or discomfort in your scrotum.  Some blood coming from your incisions or puncture sites for one or two days.  Blood in your semen. Follow these instructions at home: Medicines   Take over-the-counter and prescription medicines only as told by your health care provider.  Avoid taking NSAIDs such as aspirin and ibuprofen, because these medicines can make bleeding worse. Activity  For the first 2 days after surgery, avoid physical activity and exercise that require a lot of energy. Ask your health care provider what activities are safe for you.  Do not participate in sports or perform heavy physical labor until your pain has improved, or until your health care provider says it is okay.  Do not ejaculate for at least 1 week after the procedure, or as long as directed.  You may resume sexual activity 7-10 days after your procedure, or when your health care provider approves. Use a different method of birth control (contraception) until you have had test results that confirm that there is no sperm in your semen. Scrotal support  Use scrotal support, such as a jock strap or underwear with a supportive pouch, as needed for one week after your procedure.  If you feel discomfort in your scrotum, you may remove the scrotal support to see if the discomfort is relieved. Sometimes scrotal support can press on the scrotum and cause or worsen discomfort.  If your skin gets irritated, you may add some germ-free (sterile), fluffed bandages or a clean washcloth to the scrotal support. General instructions  Put ice on the injured area: ? Put  ice in a plastic bag. ? Place a towel between your skin and the bag. ? Leave the ice on for 20 minutes, 2-3 times a day.  Check your incisions or puncture sites every day for signs of infection. Check for: ? Redness, swelling, or pain. ? Fluid or blood. ? Warmth. ? Pus or a bad smell.  Leave stitches (sutures) in place. The sutures will dissolve on their own and do not need to be removed.  Keep all follow-up visits as told by your health care provider. This is important because you will need a test to confirm that there is no sperm in your semen. Multiple ejaculations are needed to clear out sperm that were beyond the vasectomy site. You will need one test result showing that there is no sperm in your semen before you can resume unprotected sex. This may take 2-4 months after your procedure.  Do not drive for 24 hours if you were given a sedative to help you relax. Contact a health care provider if:  You have redness, swelling, or more pain around your incision or puncture site, or in your scrotum area in general.  You have bleeding from your incision or puncture site.  You have pus or a bad smell coming from your incision or puncture site.  You have a fever.  Your incision or puncture site opens up. Get help right away if:  You develop a rash.  You have difficulty breathing. Summary  After your procedure it is common to have mild pain, swelling, redness, or discomfort in your scrotum.  Avoid physical activity and exercise that requires a lot of energy for the first 2 days after surgery.  Put ice on the injured area. Leave the ice on for 20 minutes, 2-3 times a day.  Do not drive for 24 hours if you were given a sedative to help you relax. This information is not intended to replace advice given to you by your health care provider. Make sure you discuss any questions you have with your health care provider. Document Revised: 01/01/2017 Document Reviewed: 04/17/2016 Elsevier  Patient Education  [place].

## 2019-07-06 NOTE — Progress Notes (Signed)
VASECTOMY PROCEDURE NOTE:  The patient was taken to the minor procedure room and placed in the supine position. His genitals were prepped and draped in the usual sterile fashion. The right vas deferens was brought up to the skin of the right upper scrotum. The skin overlying it was anesthetized with 1% lidocaine without epinephrine, anesthetic was also injected alongside the vas deferens in the direction of the inguinal canal. The no scalpel vasectomy instrument was used to make a small perforation in the scrotal skin. The vasectomy clamp was used to grasp the vas deferens. It was carefully dissected free from surrounding structures. A 1cm segment of the vas was removed, and the cut ends of the mucosa were cauterized. A figure of eight suture was used to perform fascial interposition. No significant bleeding was noted. The vas deferens was returned to the scrotum. The skin incision was closed with a simple interrupted stitch of 4-0 chromic.  Attention was then turned to the left side. The left vasectomy was performed in the same exact fashion. Sterile dressings were placed over each incision. The patient tolerated the procedure well.  IMPRESSION/DIAGNOSIS: The patient is a 39 year old gentleman who underwent a vasectomy today. Post-procedure instructions were reviewed. I stressed the importance of continuing to use birth control until he provides a semen specimen more than 2 months from now that demonstrates azoospermia.  We discussed return precautions including fever over 101, significant bleeding or hematoma, or uncontrolled pain. I also stressed the importance of avoiding strenuous activity for one week, no sexual activity or ejaculations for 5 days, intermittent icing over the next 48 hours, and scrotal support.   PLAN: The patient will be advised of his semen analysis results when available.   Nickolas Madrid, MD 07/06/2019

## 2019-07-29 DIAGNOSIS — G4733 Obstructive sleep apnea (adult) (pediatric): Secondary | ICD-10-CM | POA: Diagnosis not present

## 2019-08-28 DIAGNOSIS — G4733 Obstructive sleep apnea (adult) (pediatric): Secondary | ICD-10-CM | POA: Diagnosis not present

## 2019-09-28 DIAGNOSIS — G4733 Obstructive sleep apnea (adult) (pediatric): Secondary | ICD-10-CM | POA: Diagnosis not present

## 2019-10-06 ENCOUNTER — Other Ambulatory Visit: Payer: BC Managed Care – PPO

## 2019-10-06 ENCOUNTER — Other Ambulatory Visit: Payer: Self-pay

## 2019-10-06 DIAGNOSIS — Z302 Encounter for sterilization: Secondary | ICD-10-CM

## 2019-10-08 LAB — POST-VAS SPERM EVALUATION,QUAL: Volume: 0.9 mL

## 2019-10-10 ENCOUNTER — Telehealth: Payer: Self-pay

## 2019-10-10 NOTE — Telephone Encounter (Signed)
-----   Message from [place], MD sent at 10/10/2019  7:44 AM EDT ----- No sperm seen, ok to discontinue alternative contraception  Nickolas Madrid, MD 10/10/2019

## 2019-10-10 NOTE — Telephone Encounter (Signed)
See my chart message

## 2020-01-08 ENCOUNTER — Ambulatory Visit: Payer: BC Managed Care – PPO

## 2020-02-13 ENCOUNTER — Other Ambulatory Visit: Payer: Self-pay | Admitting: Family Medicine

## 2020-02-13 DIAGNOSIS — E785 Hyperlipidemia, unspecified: Secondary | ICD-10-CM

## 2020-02-13 DIAGNOSIS — R7309 Other abnormal glucose: Secondary | ICD-10-CM

## 2020-02-14 ENCOUNTER — Other Ambulatory Visit: Payer: BC Managed Care – PPO

## 2020-02-21 ENCOUNTER — Ambulatory Visit (INDEPENDENT_AMBULATORY_CARE_PROVIDER_SITE_OTHER): Payer: BC Managed Care – PPO | Admitting: Family Medicine

## 2020-02-21 ENCOUNTER — Encounter: Payer: Self-pay | Admitting: Family Medicine

## 2020-02-21 ENCOUNTER — Other Ambulatory Visit: Payer: Self-pay

## 2020-02-21 VITALS — BP 156/96 | HR 77 | Temp 97.7°F | Ht 69.5 in | Wt 224.1 lb

## 2020-02-21 DIAGNOSIS — E785 Hyperlipidemia, unspecified: Secondary | ICD-10-CM | POA: Diagnosis not present

## 2020-02-21 DIAGNOSIS — Z0000 Encounter for general adult medical examination without abnormal findings: Secondary | ICD-10-CM

## 2020-02-21 DIAGNOSIS — R7309 Other abnormal glucose: Secondary | ICD-10-CM

## 2020-02-21 DIAGNOSIS — E669 Obesity, unspecified: Secondary | ICD-10-CM

## 2020-02-21 DIAGNOSIS — I10 Essential (primary) hypertension: Secondary | ICD-10-CM

## 2020-02-21 DIAGNOSIS — Z9989 Dependence on other enabling machines and devices: Secondary | ICD-10-CM

## 2020-02-21 DIAGNOSIS — G4733 Obstructive sleep apnea (adult) (pediatric): Secondary | ICD-10-CM

## 2020-02-21 NOTE — Assessment & Plan Note (Signed)
Update A1c in setting of weight gain noted.

## 2020-02-21 NOTE — Assessment & Plan Note (Signed)
Appreciate pulm care. Doing well on AutoPAP.

## 2020-02-21 NOTE — Assessment & Plan Note (Signed)
Reviewed weight gain noted as well as healthy diet and lifestyle changes to affect sustainable weight loss. Suggested heart healthy mediterranean diet.

## 2020-02-21 NOTE — Progress Notes (Signed)
Patient ID: Alan Barber, male    DOB: [birth year], 40 y.o.   MRN: [id number]  This visit was conducted in person.  BP (!) 156/96 (Cuff Size: Large)   Pulse 77   Temp 97.7 F (36.5 C) (Temporal)   Ht 5' 9.5" (1.765 m)   Wt 224 lb 1 oz (101.6 kg)   SpO2 99%   BMI 32.61 kg/m    CC: CPE Subjective:   HPI: Alan Barber is a 40 y.o. male presenting on 02/21/2020 for Annual Exam   Weight gain noted, 20 lbs over the past 6 months. BP elevation noted as well. Has not been checking at home. No HA, vision changes, CP/tightness, SOB, leg swelling.   OSA - now on CPAP, saw Dr Lamonte Sakai. AutoPAP setting. [facility].   COVID illness 06/2019, 01/2020 - fully recovered from this.   Building house to be complete in 6wks. Poor high sodium diet over the past few months.  S/p cholecystectomy - occasional RUQ discomfort - noted as difficulty taking deep breath Soccer indoor facility closed due to [place].    Preventative: Flu shot - declines  COVID vaccine - discussed, declines Td 2008, Tdap 02/2019  Seat belt use discussed. Sunscreen use discussed, no changing moles on skin. Saw derm last month.  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 Interim medical history since our last visit reviewed. Allergies and medications reviewed and updated. Outpatient Medications Prior to Visit  Medication Sig Dispense Refill  . B Complex Vitamins (VITAMIN B COMPLEX PO) Take by mouth.    . cetirizine (ZYRTEC) 10 MG tablet Take 1 tablet (10 mg total) by mouth daily as needed for allergies.    .  Cholecalciferol (VITAMIN D3 PO) Take by mouth.    . diazepam (VALIUM) 5 MG tablet Take 1 tablet (5 mg total) by mouth once as needed for up to 1 dose (take 30 minutes before vasectomy). 1 tablet 0  . fluticasone (FLONASE) 50 MCG/ACT nasal spray Place 2 sprays into both nostrils daily as needed for allergies or rhinitis.    Marland Kitchen OVER THE COUNTER MEDICATION Essential oils    . Probiotic Product (PROBIOTIC DAILY PO) Take by mouth daily.    . diazepam (VALIUM) 5 MG tablet Take 1 tablet (5 mg total) by mouth once as needed for up to 1 dose (take 30 minutes prior to vasectomy). 1 tablet 0   No facility-administered medications prior to visit.     Per HPI unless specifically indicated in ROS section below Review of Systems  Constitutional: Positive for fever (recent COVID over Christmas). Negative for activity change, appetite change, chills, fatigue and unexpected weight change.  HENT: Negative for hearing loss.   Eyes: Negative for visual disturbance.  Respiratory: Positive for cough (allergies). Negative for chest tightness, shortness of breath and wheezing.   Cardiovascular: Negative for chest pain, palpitations and leg swelling.  Gastrointestinal: Negative for abdominal distention, abdominal pain, blood in stool, constipation, diarrhea, nausea and vomiting.  Genitourinary: Negative for difficulty urinating and hematuria.  Musculoskeletal: Negative for arthralgias, myalgias and neck pain.  Skin: Negative for rash.  Neurological: Negative for dizziness, seizures, syncope and headaches.  Hematological: Negative for adenopathy. Does not bruise/bleed easily.  Psychiatric/Behavioral: Negative for dysphoric mood. The patient is not nervous/anxious.        Increased stress recently   Objective:  BP (!) 156/96 (Cuff Size: Large)   Pulse 77   Temp 97.7 F (36.5 C) (Temporal)   Ht 5' 9.5" (1.765 m)   Wt 224 lb 1 oz (101.6 kg)   SpO2 99%   BMI 32.61 kg/m   Wt Readings from Last 3 Encounters:   02/21/20 224 lb 1 oz (101.6 kg)  07/06/19 205 lb (93 kg)  03/22/19 205 lb (93 kg)      Physical Exam Vitals and nursing note reviewed.  Constitutional:      General: He is not in acute distress.    Appearance: Normal appearance. He is well-developed and well-nourished. He is not ill-appearing.  HENT:     Head: Normocephalic and atraumatic.     Right Ear: Hearing, tympanic membrane, ear canal and external ear normal.     Left Ear: Hearing, tympanic membrane, ear canal and external ear normal.     Mouth/Throat:     Mouth: Oropharynx is clear and moist and mucous membranes are normal.     Pharynx: No posterior oropharyngeal edema.  Eyes:     General: No scleral icterus.    Extraocular Movements: Extraocular movements intact and EOM normal.     Conjunctiva/sclera: Conjunctivae normal.     Pupils: Pupils are equal, round, and reactive to light.  Neck:     Thyroid: No thyroid mass or thyromegaly.  Cardiovascular:     Rate and Rhythm: Normal rate and regular rhythm.     Pulses: Normal pulses and intact distal pulses.          Radial pulses are 2+ on the right side and 2+ on the left side.     Heart sounds: Normal heart sounds. No murmur heard.   Pulmonary:     Effort: Pulmonary effort is normal. No respiratory distress.     Breath sounds: Normal breath sounds. No wheezing, rhonchi or rales.  Abdominal:     General: Abdomen is flat. Bowel sounds are normal. There is no distension.     Palpations: Abdomen is soft. There is no mass.     Tenderness: There is no abdominal tenderness. There is no guarding or rebound.     Hernia: No hernia is present.  Musculoskeletal:        General: No edema. Normal range of motion.     Cervical back: Normal range of motion and neck supple.     Right lower leg: No edema.     Left lower leg: No edema.  Lymphadenopathy:     Cervical: No cervical adenopathy.  Skin:    General: Skin is warm and dry.     Findings: No rash.  Neurological:      General: No focal deficit present.     Mental Status: He is alert and oriented to person, place, and time.     Comments: CN grossly intact, station and gait intact  Psychiatric:        Mood and Affect: Mood and affect and mood normal.        Behavior: Behavior normal.        Thought Content: Thought content normal.        Judgment: Judgment normal.       Assessment & Plan:  This visit occurred during the SARS-CoV-2 public health emergency.  Safety protocols were in place, including screening questions prior to the visit, additional usage of staff PPE, and extensive cleaning of exam room while observing appropriate contact time as indicated for disinfecting solutions.   Problem List Items Addressed This Visit    Primary hypertension    BP elevated today - declines medication at this time. Will renew efforts at low sodium diet, weight loss, and start aerobic exercise routine.  He will buy BP cuff and start monitoring at home, notify me if persistently >150/100 to start medication.  RTC 2 mo HTN f/u visit.      Relevant Orders   Microalbumin / creatinine urine ratio   TSH   PREDIABETES    Update A1c in setting of weight gain noted.       Relevant Orders   Hemoglobin A1c   OSA on CPAP    Appreciate pulm care. Doing well on AutoPAP.       Obesity, Class I, BMI 30.0-34.9 (see actual BMI)    Reviewed weight gain noted as well as healthy diet and lifestyle changes to affect sustainable weight loss. Suggested heart healthy mediterranean diet.       Healthcare maintenance - Primary    Preventative protocols reviewed and updated unless pt declined. Discussed healthy diet and lifestyle.       Dyslipidemia    Update cholesterol levels today.  The ASCVD Risk score Mikey Bussing DC Jr., et al., 2013) failed to calculate for the following reasons:   Unable to determine if patient is Non-Hispanic African American       Relevant Orders   Lipid panel   Comprehensive metabolic panel        No orders of the defined types were placed in this encounter.  Orders Placed This Encounter  Procedures  . Lipid panel  . Comprehensive metabolic panel  . Hemoglobin A1c  . Microalbumin / creatinine urine ratio  . TSH    Patient instructions: Blood pressures were too high.  Labs today.  Your goal blood pressure is <140/90, ideally >130/80.  Work on low salt/sodium diet - goal <1.5gm (1,500mg ) per day. Eat a diet high in fruits/vegetables and whole grains.  Look into mediterranean and DASH diet. Goal activity is 156min/wk of moderate intensity exercise.  This can be split into 30 minute chunks.  If you are not at this level, you can start with smaller 10-15 min increments and slowly build up activity. Look at [place].org for more resources.  Return in 2 months for BP follow up Keep track of blood pressures at home, let me know sooner if consistently >150/100 to discuss medication while we get to goals.  Follow up plan: Return in about 2 months (around 04/20/2020) for follow up visit.  Ria Bush, MD

## 2020-02-21 NOTE — Assessment & Plan Note (Signed)
Preventative protocols reviewed and updated unless pt declined. Discussed healthy diet and lifestyle.  

## 2020-02-21 NOTE — Assessment & Plan Note (Signed)
Update cholesterol levels today.  The ASCVD Risk score Alan Bussing DC Jr., et al., 2013) failed to calculate for the following reasons:   Unable to determine if patient is Non-Hispanic African American

## 2020-02-21 NOTE — Assessment & Plan Note (Signed)
BP elevated today - declines medication at this time. Will renew efforts at low sodium diet, weight loss, and start aerobic exercise routine.  He will buy BP cuff and start monitoring at home, notify me if persistently >150/100 to start medication.  RTC 2 mo HTN f/u visit.

## 2020-02-21 NOTE — Patient Instructions (Addendum)
Blood pressures were too high.  Labs today.  Your goal blood pressure is <140/90, ideally >130/80.  Work on low salt/sodium diet - goal <1.5gm (1,500mg ) per day. Eat a diet high in fruits/vegetables and whole grains.  Look into mediterranean and DASH diet. Goal activity is 11min/wk of moderate intensity exercise.  This can be split into 30 minute chunks.  If you are not at this level, you can start with smaller 10-15 min increments and slowly build up activity. Look at [place].org for more resources.  Return in 2 months for BP follow up Keep track of blood pressures at home, let me know sooner if consistently >150/100 to discuss medication while we get to goals.   Health Maintenance, Male Adopting a healthy lifestyle and getting preventive care are important in promoting health and wellness. Ask your health care provider about:  The right schedule for you to have regular tests and exams.  Things you can do on your own to prevent diseases and keep yourself healthy. What should I know about diet, weight, and exercise? Eat a healthy diet  Eat a diet that includes plenty of vegetables, fruits, low-fat dairy products, and lean protein.  Do not eat a lot of foods that are high in solid fats, added sugars, or sodium.   Maintain a healthy weight Body mass index (BMI) is a measurement that can be used to identify possible weight problems. It estimates body fat based on height and weight. Your health care provider can help determine your BMI and help you achieve or maintain a healthy weight. Get regular exercise Get regular exercise. This is one of the most important things you can do for your health. Most adults should:  Exercise for at least 150 minutes each week. The exercise should increase your heart rate and make you sweat (moderate-intensity exercise).  Do strengthening exercises at least twice a week. This is in addition to the moderate-intensity exercise.  Spend less time sitting.  Even light physical activity can be beneficial. Watch cholesterol and blood lipids Have your blood tested for lipids and cholesterol at 42 years of age, then have this test every 5 years. You may need to have your cholesterol levels checked more often if:  Your lipid or cholesterol levels are high.  You are older than 42 years of age.  You are at high risk for heart disease. What should I know about cancer screening? Many types of cancers can be detected early and may often be prevented. Depending on your health history and family history, you may need to have cancer screening at various ages. This may include screening for:  Colorectal cancer.  Prostate cancer.  Skin cancer.  Lung cancer. What should I know about heart disease, diabetes, and high blood pressure? Blood pressure and heart disease  High blood pressure causes heart disease and increases the risk of stroke. This is more likely to develop in people who have high blood pressure readings, are of African descent, or are overweight.  Talk with your health care provider about your target blood pressure readings.  Have your blood pressure checked: ? Every 3-5 years if you are 2-82 years of age. ? Every year if you are 59 years old or older.  If you are between the ages of 54 and 47 and are a current or former smoker, ask your health care provider if you should have a one-time screening for abdominal aortic aneurysm (AAA). Diabetes Have regular diabetes screenings. This checks your fasting blood sugar  level. Have the screening done:  Once every three years after age 64 if you are at a normal weight and have a low risk for diabetes.  More often and at a younger age if you are overweight or have a high risk for diabetes. What should I know about preventing infection? Hepatitis B If you have a higher risk for hepatitis B, you should be screened for this virus. Talk with your health care provider to find out if you are at  risk for hepatitis B infection. Hepatitis C Blood testing is recommended for:  Everyone born from [birth year] through 1967.  Anyone with known risk factors for hepatitis C. Sexually transmitted infections (STIs)  You should be screened each year for STIs, including gonorrhea and chlamydia, if: ? You are sexually active and are younger than 42 years of age. ? You are older than 41 years of age and your health care provider tells you that you are at risk for this type of infection. ? Your sexual activity has changed since you were last screened, and you are at increased risk for chlamydia or gonorrhea. Ask your health care provider if you are at risk.  Ask your health care provider about whether you are at high risk for HIV. Your health care provider may recommend a prescription medicine to help prevent HIV infection. If you choose to take medicine to prevent HIV, you should first get tested for HIV. You should then be tested every 3 months for as long as you are taking the medicine. Follow these instructions at home: Lifestyle  Do not use any products that contain nicotine or tobacco, such as cigarettes, e-cigarettes, and chewing tobacco. If you need help quitting, ask your health care provider.  Do not use street drugs.  Do not share needles.  Ask your health care provider for help if you need support or information about quitting drugs. Alcohol use  Do not drink alcohol if your health care provider tells you not to drink.  If you drink alcohol: ? Limit how much you have to 0-2 drinks a day. ? Be aware of how much alcohol is in your drink. In the [place] one drink equals one 12 oz bottle of beer (355 mL), one 5 oz glass of wine (148 mL), or one 1 oz glass of hard liquor (44 mL). General instructions  Schedule regular health, dental, and eye exams.  Stay current with your vaccines.  Tell your health care provider if: ? You often feel depressed. ? You have ever been abused or do not  feel safe at home. Summary  Adopting a healthy lifestyle and getting preventive care are important in promoting health and wellness.  Follow your health care provider's instructions about healthy diet, exercising, and getting tested or screened for diseases.  Follow your health care provider's instructions on monitoring your cholesterol and blood pressure. This information is not intended to replace advice given to you by your health care provider. Make sure you discuss any questions you have with your health care provider. Document Revised: 01/12/2018 Document Reviewed: 01/12/2018 Elsevier Patient Education  2021 [employer].

## 2020-02-22 LAB — COMPREHENSIVE METABOLIC PANEL
ALT: 51 U/L (ref 0–53)
AST: 27 U/L (ref 0–37)
Albumin: 4.9 g/dL (ref 3.5–5.2)
Alkaline Phosphatase: 73 U/L (ref 39–117)
BUN: 11 mg/dL (ref 6–23)
CO2: 29 mEq/L (ref 19–32)
Calcium: 9.7 mg/dL (ref 8.4–10.5)
Chloride: 99 mEq/L (ref 96–112)
Creatinine, Ser: 1.11 mg/dL (ref 0.40–1.50)
GFR: 82.53 mL/min (ref >60.00)
Glucose, Bld: 84 mg/dL (ref 70–99)
Potassium: 4.1 mEq/L (ref 3.5–5.1)
Sodium: 135 mEq/L (ref 135–145)
Total Bilirubin: 0.5 mg/dL (ref 0.2–1.2)
Total Protein: 8.2 g/dL (ref 6.0–8.3)

## 2020-02-22 LAB — LIPID PANEL
Cholesterol: 220 mg/dL — ABNORMAL HIGH (ref 0–200)
HDL: 44.8 mg/dL (ref >39.00)
NonHDL: 175.66
Total CHOL/HDL Ratio: 5
Triglycerides: 234 mg/dL — ABNORMAL HIGH (ref 0.0–149.0)
VLDL: 46.8 mg/dL — ABNORMAL HIGH (ref 0.0–40.0)

## 2020-02-22 LAB — MICROALBUMIN / CREATININE URINE RATIO
Creatinine,U: 130.3 mg/dL
Microalb Creat Ratio: 1.1 mg/g (ref 0.0–30.0)
Microalb, Ur: 1.4 mg/dL (ref 0.0–1.9)

## 2020-02-22 LAB — TSH: TSH: 1.4 u[IU]/mL (ref 0.35–4.50)

## 2020-02-22 LAB — HEMOGLOBIN A1C: Hgb A1c MFr Bld: 6.5 % (ref 4.6–6.5)

## 2020-02-22 LAB — LDL CHOLESTEROL, DIRECT: Direct LDL: 142 mg/dL

## 2021-01-21 ENCOUNTER — Emergency Department (HOSPITAL_COMMUNITY): Payer: 59

## 2021-01-21 ENCOUNTER — Emergency Department (HOSPITAL_COMMUNITY)
Admission: EM | Admit: 2021-01-21 | Discharge: 2021-01-22 | Disposition: A | Discharge status: Home / Self Care | Payer: 59 | Attending: Emergency Medicine | Admitting: Emergency Medicine

## 2021-01-21 DIAGNOSIS — Z5321 Procedure and treatment not carried out due to patient leaving prior to being seen by health care provider: Secondary | ICD-10-CM | POA: Insufficient documentation

## 2021-01-21 DIAGNOSIS — R0789 Other chest pain: Secondary | ICD-10-CM | POA: Insufficient documentation

## 2021-01-21 LAB — CBC
HCT: 43.8 % (ref 39.0–52.0)
Hemoglobin: 14.4 g/dL (ref 13.0–17.0)
MCH: 27.3 pg (ref 26.0–34.0)
MCHC: 32.9 g/dL (ref 30.0–36.0)
MCV: 83.1 fL (ref 80.0–100.0)
Platelets: 283 10*3/uL (ref 150–400)
RBC: 5.27 MIL/uL (ref 4.22–5.81)
RDW: 12 % (ref 11.5–15.5)
WBC: 13.3 10*3/uL — ABNORMAL HIGH (ref 4.0–10.5)
nRBC: 0 % (ref 0.0–0.2)

## 2021-01-21 MED ORDER — MORPHINE SULFATE (PF) 2 MG/ML IV SOLN: 4.0000 mg | Freq: Once | INTRAVENOUS | Status: DC

## 2021-01-21 MED ORDER — NITROGLYCERIN 0.4 MG SL SUBL
0.4000 mg | SUBLINGUAL_TABLET | SUBLINGUAL | Status: DC | PRN
Start: 2021-01-21 — End: 2021-01-22
  Administered 2021-01-21: 23:00:00 0.4 mg via SUBLINGUAL
  Filled 2021-01-21: qty 1

## 2021-01-21 NOTE — ED Triage Notes (Signed)
Pt c/o sudden-onset L sided CP after stretching & performing valsalva maneuver. Pt states he took omeprazole, ibuprofen & then 324ASA PTA. States pain is dull 1-2 & then sharp 3-4 on inspiration. Denies associated NV, SHOB, diaphoresis. Advises that PCP aware of HTN, "might be starting on meds soon."

## 2021-01-22 LAB — BASIC METABOLIC PANEL
Anion gap: 6 (ref 5–15)
BUN: 16 mg/dL (ref 6–20)
CO2: 29 mmol/L (ref 22–32)
Calcium: 9.2 mg/dL (ref 8.9–10.3)
Chloride: 95 mmol/L — ABNORMAL LOW (ref 98–111)
Creatinine, Ser: 1.15 mg/dL (ref 0.61–1.24)
GFR, Estimated: >60 mL/min (ref >60)
Glucose, Bld: 204 mg/dL — ABNORMAL HIGH (ref 70–99)
Potassium: 4.1 mmol/L (ref 3.5–5.1)
Sodium: 130 mmol/L — ABNORMAL LOW (ref 135–145)

## 2021-01-22 LAB — TROPONIN I (HIGH SENSITIVITY)
Troponin I (High Sensitivity): 4 ng/L (ref <18)
Troponin I (High Sensitivity): 5 ng/L (ref <18)

## 2021-06-02 ENCOUNTER — Ambulatory Visit
Admission: EM | Admit: 2021-06-02 | Discharge: 2021-06-02 | Disposition: A | Discharge status: Home / Self Care | Payer: No Typology Code available for payment source | Attending: Emergency Medicine | Admitting: Emergency Medicine

## 2021-06-02 ENCOUNTER — Encounter: Payer: Self-pay | Admitting: Emergency Medicine

## 2021-06-02 DIAGNOSIS — H1033 Unspecified acute conjunctivitis, bilateral: Secondary | ICD-10-CM | POA: Diagnosis not present

## 2021-06-02 DIAGNOSIS — J0100 Acute maxillary sinusitis, unspecified: Secondary | ICD-10-CM

## 2021-06-02 MED ORDER — POLYMYXIN B-TRIMETHOPRIM 10000-0.1 UNIT/ML-% OP SOLN: 1.0000 [drp] | Freq: Four times a day (QID) | OPHTHALMIC | 0 refills | Status: AC

## 2021-06-02 MED ORDER — AMOXICILLIN 875 MG PO TABS
875.0000 mg | ORAL_TABLET | Freq: Two times a day (BID) | ORAL | 0 refills | Status: AC
Start: 2021-06-02 — End: 2021-06-12

## 2021-06-02 NOTE — ED Triage Notes (Signed)
Pt presents with cough, runny nose x 3 weeks. He also has bilateral eye drainage and redness x 3 days  ?

## 2021-06-02 NOTE — ED Provider Notes (Signed)
?[facility] ? ? ? ?CSN: [id number] ?Arrival date & time: 06/02/21  0955 ? ? ?  ? ?History   ?Chief Complaint ?Chief Complaint  ?Patient presents with  ? Conjunctivitis  ? Nasal Congestion  ? Cough  ? ? ?HPI ?Alan Barber is a 41 y.o. male.  Patient presents with 3-week history of congestion, runny nose, sinus pressure, nonproductive cough.  Treatment at home with OTC cold and sinus medications without relief.  He also has bilateral eye redness, itching, drainage, crusting in his lashes x3 days.  He denies acute eye pain, eye injury, changes in vision.  No fever, rash, sore throat, ear pain, shortness of breath, vomiting, diarrhea, or other symptoms.  His medical history includes allergic rhinitis, hypertension, OSA, prediabetes, fatty liver, obesity, dyslipidemia. ? ?The history is provided by the patient and medical records.  ? ?Past Medical History:  ?Diagnosis Date  ? COVID-19 virus infection 06/2019  ? GERD (gastroesophageal reflux disease)   ? HLD (hyperlipidemia)   ? Mastocytosis   ? Nonalcoholic fatty liver disease   ? has seen GI (Dr. Justus Memory, [place])  ? Seasonal allergies   ? Tension headache   ? ? ?Patient Active Problem List  ? Diagnosis Date Noted  ? Primary hypertension 02/21/2020  ? OSA on CPAP 02/20/2019  ? Upper back pain on right side 02/20/2019  ? Healthcare maintenance 10/27/2011  ? Dyslipidemia 04/14/2010  ? Obesity, Class I, BMI 30.0-34.9 (see actual BMI) 04/14/2010  ? TENSION HEADACHE 04/14/2010  ? ALLERGIC RHINITIS 04/14/2010  ? GERD 04/14/2010  ? Fatty liver disease, nonalcoholic 27/78/2423  ? PREDIABETES 04/14/2010  ? ? ?Past Surgical History:  ?Procedure Laterality Date  ? abd ultrasound  04/2008  ? small gallbladder polyps, mild fatty infiltration of liver  ? LAPAROSCOPIC CHOLECYSTECTOMY  11/2011  ? gallstones Tamala Julian)  ? VASECTOMY  07/06/2019  ? ? ? ? ? ?Home Medications   ? ?Prior to Admission medications   ?Medication Sig Start Date End Date Taking? Authorizing Provider  ?amoxicillin  (AMOXIL) 875 MG tablet Take 1 tablet (875 mg total) by mouth 2 (two) times daily for 10 days. 06/02/21 06/12/21 Yes Sharion Balloon, NP  ?trimethoprim-polymyxin b (POLYTRIM) ophthalmic solution Place 1 drop into both eyes 4 (four) times daily for 7 days. 06/02/21 06/09/21 Yes Sharion Balloon, NP  ?B Complex Vitamins (VITAMIN B COMPLEX PO) Take by mouth.    [provider]  ?cetirizine (ZYRTEC) 10 MG tablet Take 1 tablet (10 mg total) by mouth daily as needed for allergies. 02/20/19   Ria Bush, MD  ?Cholecalciferol (VITAMIN D3 PO) Take by mouth.    [provider]  ?diazepam (VALIUM) 5 MG tablet Take 1 tablet (5 mg total) by mouth once as needed for up to 1 dose (take 30 minutes before vasectomy). 03/23/19   Billey Co, MD  ?fluticasone (FLONASE) 50 MCG/ACT nasal spray Place 2 sprays into both nostrils daily as needed for allergies or rhinitis. 02/20/19   Ria Bush, MD  ?OVER THE COUNTER MEDICATION Essential oils    [provider]  ?Probiotic Product (PROBIOTIC DAILY PO) Take by mouth daily.    [provider]  ? ? ?Family History ?Family History  ?Problem Relation Age of Onset  ? Diabetes type II Mother   ? Hypertension Father   ? Breast cancer Paternal Grandmother   ? Kidney disease Maternal Grandfather   ? Cancer Paternal Grandfather   ?     skin (?melanoma)  ? Heart  disease Maternal Grandmother   ? ? ?Social History ?Social History  ? ?Tobacco Use  ? Smoking status: Never  ? Smokeless tobacco: Never  ?Vaping Use  ? Vaping Use: Never used  ?Substance Use Topics  ? Alcohol use: No  ?  Alcohol/week: 0.0 standard drinks  ? Drug use: No  ? ? ? ?Allergies   ?Patient has no known allergies. ? ? ?Review of Systems ?Review of Systems  ?Constitutional:  Negative for chills and fever.  ?HENT:  Positive for congestion, rhinorrhea and sinus pressure. Negative for ear pain and sore throat.   ?Eyes:  Positive for discharge, redness and itching. Negative for pain and visual  disturbance.  ?Respiratory:  Positive for cough. Negative for shortness of breath.   ?Cardiovascular:  Negative for chest pain and palpitations.  ?Gastrointestinal:  Negative for diarrhea and vomiting.  ?Skin:  Negative for color change and rash.  ?All other systems reviewed and are negative. ? ? ?Physical Exam ?Triage Vital Signs ?ED Triage Vitals  ?Enc Vitals Group  ?   BP   ?   Pulse   ?   Resp   ?   Temp   ?   Temp src   ?   SpO2   ?   Weight   ?   Height   ?   Head Circumference   ?   Peak Flow   ?   Pain Score   ?   Pain Loc   ?   Pain Edu?   ?   Excl. in [place]?   ? ?No data found. ? ?Updated Vital Signs ?BP (!) 146/95   Pulse 87   Temp 98.1 ?F (36.7 ?C)   Resp 18   SpO2 96%  ? ?Visual Acuity ?Right Eye Distance: 20/16 ?Left Eye Distance: 20/16 ?Bilateral Distance: 20/16 ? ?Right Eye Near:   ?Left Eye Near:    ?Bilateral Near:    ? ?Physical Exam ?Vitals and nursing note reviewed.  ?Constitutional:   ?   General: He is not in acute distress. ?   Appearance: Normal appearance. He is well-developed. He is not ill-appearing.  ?HENT:  ?   Head: Normocephalic and atraumatic.  ?   Right Ear: Tympanic membrane normal.  ?   Left Ear: Tympanic membrane normal.  ?   Nose: Congestion present.  ?   Mouth/Throat:  ?   Mouth: Mucous membranes are moist.  ?   Pharynx: Oropharynx is clear.  ?Eyes:  ?   General: Lids are normal. Vision grossly intact.  ?   Extraocular Movements: Extraocular movements intact.  ?   Conjunctiva/sclera:  ?   Right eye: Right conjunctiva is injected.  ?   Left eye: Left conjunctiva is injected.  ?   Pupils: Pupils are equal, round, and reactive to light.  ?Cardiovascular:  ?   Rate and Rhythm: Normal rate and regular rhythm.  ?   Heart sounds: Normal heart sounds.  ?Pulmonary:  ?   Effort: Pulmonary effort is normal. No respiratory distress.  ?   Breath sounds: Normal breath sounds.  ?Musculoskeletal:  ?   Cervical back: Neck supple.  ?Skin: ?   General: Skin is warm and dry.  ?Neurological:  ?    Mental Status: He is alert.  ?Psychiatric:     ?   Mood and Affect: Mood normal.     ?   Behavior: Behavior normal.  ? ? ? ?UC Treatments / Results  ?Labs ?(all labs ordered  are listed, but only abnormal results are displayed) ?Labs Reviewed - No data to display ? ?EKG ? ? ?Radiology ?No results found. ? ?Procedures ?Procedures (including critical care time) ? ?Medications Ordered in UC ?Medications - No data to display ? ?Initial Impression / Assessment and Plan / UC Course  ?I have reviewed the triage vital signs and the nursing notes. ? ?Pertinent labs & imaging results that were available during my care of the patient were reviewed by me and considered in my medical decision making (see chart for details). ? ?  ?Acute sinusitis, acute bilateral conjunctivitis.  Treating with amoxicillin and Polytrim eyedrops.  Instructed patient to follow-up with his PCP if his symptoms are not improving.  He agrees to plan of care. ? ?Final Clinical Impressions(s) / UC Diagnoses  ? ?Final diagnoses:  ?Acute non-recurrent maxillary sinusitis  ?Acute conjunctivitis of both eyes, unspecified acute conjunctivitis type  ? ? ? ?Discharge Instructions   ? ?  ?Use the eye drops and take the amoxicillin as directed.  Follow up with your primary care provider if your symptoms are not improving.   ? ? ? ? ? ?ED Prescriptions   ? ? Medication Sig Dispense Auth. Provider  ? trimethoprim-polymyxin b (POLYTRIM) ophthalmic solution Place 1 drop into both eyes 4 (four) times daily for 7 days. 10 mL Sharion Balloon, NP  ? amoxicillin (AMOXIL) 875 MG tablet Take 1 tablet (875 mg total) by mouth 2 (two) times daily for 10 days. 20 tablet Sharion Balloon, NP  ? ?  ? ?PDMP not reviewed this encounter. ?  ?Sharion Balloon, NP ?06/02/21 1052 ? ?

## 2021-06-02 NOTE — Discharge Instructions (Addendum)
Use the eye drops and take the amoxicillin as directed.  Follow up with your primary care provider if your symptoms are not improving.   ? ?

## 2021-10-28 ENCOUNTER — Other Ambulatory Visit: Payer: Self-pay | Admitting: Family Medicine

## 2021-10-28 ENCOUNTER — Ambulatory Visit (INDEPENDENT_AMBULATORY_CARE_PROVIDER_SITE_OTHER): Payer: No Typology Code available for payment source | Admitting: Family Medicine

## 2021-10-28 ENCOUNTER — Encounter: Payer: Self-pay | Admitting: Family Medicine

## 2021-10-28 VITALS — BP 140/82 | HR 77 | Temp 97.3°F | Ht 69.5 in | Wt 191.2 lb

## 2021-10-28 DIAGNOSIS — G4733 Obstructive sleep apnea (adult) (pediatric): Secondary | ICD-10-CM

## 2021-10-28 DIAGNOSIS — E663 Overweight: Secondary | ICD-10-CM | POA: Diagnosis not present

## 2021-10-28 DIAGNOSIS — E1169 Type 2 diabetes mellitus with other specified complication: Secondary | ICD-10-CM | POA: Diagnosis not present

## 2021-10-28 DIAGNOSIS — Z9989 Dependence on other enabling machines and devices: Secondary | ICD-10-CM

## 2021-10-28 DIAGNOSIS — E1165 Type 2 diabetes mellitus with hyperglycemia: Secondary | ICD-10-CM | POA: Diagnosis not present

## 2021-10-28 DIAGNOSIS — I10 Essential (primary) hypertension: Secondary | ICD-10-CM

## 2021-10-28 DIAGNOSIS — Z794 Long term (current) use of insulin: Secondary | ICD-10-CM | POA: Insufficient documentation

## 2021-10-28 DIAGNOSIS — E785 Hyperlipidemia, unspecified: Secondary | ICD-10-CM

## 2021-10-28 LAB — COMPREHENSIVE METABOLIC PANEL
ALT: 46 U/L (ref 0–53)
AST: 28 U/L (ref 0–37)
Albumin: 4.7 g/dL (ref 3.5–5.2)
Alkaline Phosphatase: 93 U/L (ref 39–117)
BUN: 17 mg/dL (ref 6–23)
CO2: 29 mEq/L (ref 19–32)
Calcium: 9.6 mg/dL (ref 8.4–10.5)
Chloride: 94 mEq/L — ABNORMAL LOW (ref 96–112)
Creatinine, Ser: 1.08 mg/dL (ref 0.40–1.50)
GFR: 84.29 mL/min (ref >60.00)
Glucose, Bld: 256 mg/dL — ABNORMAL HIGH (ref 70–99)
Potassium: 4.5 mEq/L (ref 3.5–5.1)
Sodium: 132 mEq/L — ABNORMAL LOW (ref 135–145)
Total Bilirubin: 0.7 mg/dL (ref 0.2–1.2)
Total Protein: 7.5 g/dL (ref 6.0–8.3)

## 2021-10-28 LAB — POCT GLYCOSYLATED HEMOGLOBIN (HGB A1C): Hemoglobin A1C: 12.7 % — AB (ref 4.0–5.6)

## 2021-10-28 LAB — LIPID PANEL
Cholesterol: 242 mg/dL — ABNORMAL HIGH (ref 0–200)
HDL: 47.9 mg/dL (ref >39.00)
LDL Cholesterol: 168 mg/dL — ABNORMAL HIGH (ref 0–99)
NonHDL: 194.4
Total CHOL/HDL Ratio: 5
Triglycerides: 133 mg/dL (ref 0.0–149.0)
VLDL: 26.6 mg/dL (ref 0.0–40.0)

## 2021-10-28 MED ORDER — METFORMIN HCL ER 500 MG PO TB24: 500.0000 mg | ORAL_TABLET | Freq: Every day | ORAL | 6 refills | Status: DC

## 2021-10-28 MED ORDER — INSULIN GLARGINE-YFGN 100 UNIT/ML ~~LOC~~ SOPN: 10.0000 [IU] | PEN_INJECTOR | Freq: Every day | SUBCUTANEOUS | 1 refills | Status: DC

## 2021-10-28 MED ORDER — VITAMIN D3 25 MCG (1000 UT) PO CAPS: 1.0000 | ORAL_CAPSULE | Freq: Every day | ORAL | Status: DC

## 2021-10-28 NOTE — Patient Instructions (Addendum)
We will request latest eye exam (diabetic) from eye doctor in [place] today.  Start metformin XR '500mg'$  once daily with breakfast.  Start basal insulin 10 units daily in the evening, with titration by 3 units every 3 days if fasting sugar averaging >150.  Check with pharmacy/insurance on preferred basal insulin brand and preferred glucometer brand.  Return in 1 month for follow up visit, keep me updated via MyChart.  Consider diabetes classes.   Diabetes Mellitus and Nutrition, Adult When you have diabetes, or diabetes mellitus, it is very important to have healthy eating habits because your blood sugar (glucose) levels are greatly affected by what you eat and drink. Eating healthy foods in the right amounts, at about the same times every day, can help you: Manage your blood glucose. Lower your risk of heart disease. Improve your blood pressure. Reach or maintain a healthy weight. What can affect my meal plan? Every person with diabetes is different, and each person has different needs for a meal plan. Your health care provider may recommend that you work with a dietitian to make a meal plan that is best for you. Your meal plan may vary depending on factors such as: The calories you need. The medicines you take. Your weight. Your blood glucose, blood pressure, and cholesterol levels. Your activity level. Other health conditions you have, such as heart or kidney disease. How do carbohydrates affect me? Carbohydrates, also called carbs, affect your blood glucose level more than any other type of food. Eating carbs raises the amount of glucose in your blood. It is important to know how many carbs you can safely have in each meal. This is different for every person. Your dietitian can help you calculate how many carbs you should have at each meal and for each snack. How does alcohol affect me? Alcohol can cause a decrease in blood glucose (hypoglycemia), especially if you use insulin or  take certain diabetes medicines by mouth. Hypoglycemia can be a life-threatening condition. Symptoms of hypoglycemia, such as sleepiness, dizziness, and confusion, are similar to symptoms of having too much alcohol. Do not drink alcohol if: Your health care provider tells you not to drink. You are pregnant, may be pregnant, or are planning to become pregnant. If you drink alcohol: Limit how much you have to: 0-1 drink a day for women. 0-2 drinks a day for men. Know how much alcohol is in your drink. In the [place] one drink equals one 12 oz bottle of beer (355 mL), one 5 oz glass of wine (148 mL), or one 1 oz glass of hard liquor (44 mL). Keep yourself hydrated with water, diet soda, or unsweetened iced tea. Keep in mind that regular soda, juice, and other mixers may contain a lot of sugar and must be counted as carbs. What are tips for following this plan?  Reading food labels Start by checking the serving size on the Nutrition Facts label of packaged foods and drinks. The number of calories and the amount of carbs, fats, and other nutrients listed on the label are based on one serving of the item. Many items contain more than one serving per package. Check the total grams (g) of carbs in one serving. Check the number of grams of saturated fats and trans fats in one serving. Choose foods that have a low amount or none of these fats. Check the number of milligrams (mg) of salt (sodium) in one serving. Most people should limit total sodium intake  to less than 2,300 mg per day. Always check the nutrition information of foods labeled as "low-fat" or "nonfat." These foods may be higher in added sugar or refined carbs and should be avoided. Talk to your dietitian to identify your daily goals for nutrients listed on the label. Shopping Avoid buying canned, pre-made, or processed foods. These foods tend to be high in fat, sodium, and added sugar. Shop around the outside edge of the grocery store. This  is where you will most often find fresh fruits and vegetables, bulk grains, fresh meats, and fresh dairy products. Cooking Use low-heat cooking methods, such as baking, instead of high-heat cooking methods, such as deep frying. Cook using healthy oils, such as olive, canola, or sunflower oil. Avoid cooking with butter, cream, or high-fat meats. Meal planning Eat meals and snacks regularly, preferably at the same times every day. Avoid going long periods of time without eating. Eat foods that are high in fiber, such as fresh fruits, vegetables, beans, and whole grains. Eat 4-6 oz (112-168 g) of lean protein each day, such as lean meat, chicken, fish, eggs, or tofu. One ounce (oz) (28 g) of lean protein is equal to: 1 oz (28 g) of meat, chicken, or fish. 1 egg.  cup (62 g) of tofu. Eat some foods each day that contain healthy fats, such as avocado, nuts, seeds, and fish. What foods should I eat? Fruits Berries. Apples. Oranges. Peaches. Apricots. Plums. Grapes. Mangoes. Papayas. Pomegranates. Kiwi. Cherries. Vegetables Leafy greens, including lettuce, spinach, kale, chard, collard greens, mustard greens, and cabbage. Beets. Cauliflower. Broccoli. Carrots. Green beans. Tomatoes. Peppers. Onions. Cucumbers. Brussels sprouts. Grains Whole grains, such as whole-wheat or whole-grain bread, crackers, tortillas, cereal, and pasta. Unsweetened oatmeal. Quinoa. Brown or wild rice. Meats and other proteins Seafood. Poultry without skin. Lean cuts of poultry and beef. Tofu. Nuts. Seeds. Dairy Low-fat or fat-free dairy products such as milk, yogurt, and cheese. The items listed above may not be a complete list of foods and beverages you can eat and drink. Contact a dietitian for more information. What foods should I avoid? Fruits Fruits canned with syrup. Vegetables Canned vegetables. Frozen vegetables with butter or cream sauce. Grains Refined white flour and flour products such as bread, pasta,  snack foods, and cereals. Avoid all processed foods. Meats and other proteins Fatty cuts of meat. Poultry with skin. Breaded or fried meats. Processed meat. Avoid saturated fats. Dairy Full-fat yogurt, cheese, or milk. Beverages Sweetened drinks, such as soda or iced tea. The items listed above may not be a complete list of foods and beverages you should avoid. Contact a dietitian for more information. Questions to ask a health care provider Do I need to meet with a certified diabetes care and education specialist? Do I need to meet with a dietitian? What number can I call if I have questions? When are the best times to check my blood glucose? Where to find more information: American Diabetes Association: diabetes.org Academy of Nutrition and Dietetics: eatright.[employer] of Diabetes and Digestive and Kidney Diseases: AmenCredit.is Association of Diabetes Care & Education Specialists: diabeteseducator.org Summary It is important to have healthy eating habits because your blood sugar (glucose) levels are greatly affected by what you eat and drink. It is important to use alcohol carefully. A healthy meal plan will help you manage your blood glucose and lower your risk of heart disease. Your health care provider may recommend that you work with a dietitian to make a meal plan that  is best for you. This information is not intended to replace advice given to you by your health care provider. Make sure you discuss any questions you have with your health care provider. Document Revised: 08/23/2019 Document Reviewed: 08/23/2019 Elsevier Patient Education  [place].

## 2021-10-28 NOTE — Progress Notes (Signed)
Patient ID: Alan Barber, male    DOB: [birth year], 41 y.o.   MRN: [id number]  This visit was conducted in person.  BP (!) 140/82 (BP Location: Right Arm, Cuff Size: Normal)   Pulse 77   Temp (!) 97.3 F (36.3 C) (Temporal)   Ht 5' 9.5" (1.765 m)   Wt 191 lb 4 oz (86.8 kg)   SpO2 98%   BMI 27.84 kg/m    CC: HTN, prediabetes f/u  Subjective:   HPI: Alan Barber is a 41 y.o. male presenting on 10/28/2021 for Follow-up (Here for HTN and prediabetes f/u. Requests A1c.)   Last seen 02/2020. 33 lb weight loss in interim low carb diet, started working out 03/2021.  Sudden 20-30 lb weight loss around 03/2021.  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  Injured left elbow 1 month ago. Slowly improving.   OSA on CPAP - saw Dr Lamonte Sakai 2021 previously on autoPAP setting. DME was [facility].  Notes some loose stools since cholecystectomy.   DM - A1c 6.5% (2022). He's been checking sugars once a week - fasting 200-300s. He continues low carb diet. Strong fmhx diabetes (brother, mother and maternal side with type 2). + blurry vision, urinary urgency and frequency and polydipsia, but no paresthesias. Last eye exam - within the past year - in [place]. Glucometer brand - care [employer] brand. DSME: declines, will consider.  Lab Results  Component Value Date   HGBA1C 12.7 (A) 10/28/2021   Diabetic Foot Exam - Simple   Simple Foot Form Diabetic Foot exam was performed with the following findings: Yes 10/28/2021 12:50 PM  Visual Inspection No deformities, no ulcerations, no other skin breakdown bilaterally: Yes Sensation Testing Intact to touch and monofilament testing bilaterally: Yes Pulse Check Posterior Tibialis and Dorsalis pulse intact bilaterally: Yes Comments      HTN - BP elevated recently - but improving with weight loss. No HA, vision changes, CP/tightness, SOB, leg swelling. Occ foot swelling with prolonged car rides.      Relevant past medical, surgical, family and social  history reviewed and updated as indicated. Interim medical history since our last visit reviewed. Allergies and medications reviewed and updated. Outpatient Medications Prior to Visit  Medication Sig Dispense Refill   B Complex Vitamins (VITAMIN B COMPLEX PO) Take by mouth.     cetirizine (ZYRTEC) 10 MG tablet Take 1 tablet (10 mg total) by mouth daily as needed for allergies.     fluticasone (FLONASE) 50 MCG/ACT nasal spray Place 2 sprays into both nostrils daily as needed for allergies or rhinitis.     Probiotic Product (PROBIOTIC DAILY PO) Take by mouth daily.     Cholecalciferol (VITAMIN D3 PO) Take by mouth.     diazepam (VALIUM) 5 MG tablet Take 1 tablet (5 mg total) by mouth once as needed for up to 1 dose (take 30 minutes before vasectomy). 1 tablet 0   OVER THE COUNTER MEDICATION Essential oils     No facility-administered medications prior to visit.     Per HPI unless specifically indicated in ROS section below Review of Systems  Objective:  BP (!) 140/82 (BP Location: Right Arm, Cuff Size: Normal)   Pulse 77   Temp (!) 97.3 F (36.3 C) (Temporal)   Ht 5' 9.5" (1.765 m)   Wt 191 lb 4 oz (86.8 kg)   SpO2 98%   BMI 27.84 kg/m   Wt Readings from Last 3 Encounters:  10/28/21 191 lb 4 oz (86.8 kg)  02/21/20 224 lb 1 oz (101.6 kg)  07/06/19 205 lb (93 kg)      Physical Exam Vitals and nursing note reviewed.  Constitutional:      Appearance: Normal appearance. He is not ill-appearing.  Eyes:     Extraocular Movements: Extraocular movements intact.     Conjunctiva/sclera: Conjunctivae normal.     Pupils: Pupils are equal, round, and reactive to light.  Cardiovascular:     Rate and Rhythm: Normal rate and regular rhythm.     Pulses: Normal pulses.     Heart sounds: Normal heart sounds. No murmur heard. Pulmonary:     Effort: Pulmonary effort is normal. No respiratory distress.     Breath sounds: Normal breath sounds. No wheezing, rhonchi or rales.  Musculoskeletal:      Right lower leg: No edema.     Left lower leg: No edema.     Comments: See HPI for foot exam if done  Skin:    General: Skin is warm and dry.     Findings: No rash.  Neurological:     Mental Status: He is alert.  Psychiatric:        Mood and Affect: Mood normal.        Behavior: Behavior normal.       Results for orders placed or performed in visit on 10/28/21  POCT glycosylated hemoglobin (Hb A1C)  Result Value Ref Range   Hemoglobin A1C 12.7 (A) 4.0 - 5.6 %   HbA1c POC (<> result, manual entry)     HbA1c, POC (prediabetic range)     HbA1c, POC (controlled diabetic range)      Assessment & Plan:   Problem List Items Addressed This Visit     Dyslipidemia associated with type 2 diabetes mellitus ([place])    Update FLP. Will need to discuss statin indication.  The ASCVD Risk score (Arnett DK, et al., 2019) failed to calculate for the following reasons:   Unable to determine if patient is Non-Hispanic African American       Relevant Medications   metFORMIN (GLUCOPHAGE-XR) 500 MG 24 hr tablet   insulin glargine-yfgn (SEMGLEE, YFGN,) 100 UNIT/ML Pen   Overweight (BMI 25.0-29.9)    Significant weight loss over the past year in setting of uncontrolled diabetes. Will work towards better glycemic control then reassess.       OSA on CPAP    Will need to verify CPAP use - as this may have improved with weight loss      Primary hypertension    BP borderline elevated today. Reassess at f/u       Type 2 diabetes mellitus with hyperglycemia, without long-term current use of insulin ([place]) - Primary    Significant progression since last A1c (02/2020) now with marked hyperglycemia as well as weight loss (?catabolic state). Reviewed pathophysiology of type 1 vs 2 diabetes as wellaas treatment options. Given marked A1c elevation, start basal insulin (Semglee seems to be insurance preference) as well as metformin XR '500mg'$  daily. Reviewed mechanism of action of medication as well as side  effects to watch for. Reviewed need for yearly eye exam as well as monitoring feet regularly. Offered diabetes education classes, he defers for now. Recommend start semglee 10u nightly, with slow titration over the next few weeks (3u every 3 days if average fasting sugar >150). RTC 1 mo DM f/u visit.  Check labs today including GAD autoantibody and insulin/C-peptide level to evaluate insulin resistance vs deficiency.  Relevant Medications   metFORMIN (GLUCOPHAGE-XR) 500 MG 24 hr tablet   insulin glargine-yfgn (SEMGLEE, YFGN,) 100 UNIT/ML Pen   Other Relevant Orders   POCT glycosylated hemoglobin (Hb A1C) (Completed)   Lipid panel   Comprehensive metabolic panel   Insulin and C-Peptide   Glutamic acid decarboxylase auto abs     Meds ordered this encounter  Medications   Cholecalciferol (VITAMIN D3) 25 MCG (1000 UT) CAPS    Sig: Take 1 capsule (1,000 Units total) by mouth daily. During winter months    Dispense:  30 capsule   metFORMIN (GLUCOPHAGE-XR) 500 MG 24 hr tablet    Sig: Take 1 tablet (500 mg total) by mouth daily with breakfast.    Dispense:  30 tablet    Refill:  6   insulin glargine-yfgn (SEMGLEE, YFGN,) 100 UNIT/ML Pen    Sig: Inject 10 Units into the skin daily.    Dispense:  3 mL    Refill:  1    Formulate based on affordability/insurance preference/formulary   Orders Placed This Encounter  Procedures   Lipid panel   Comprehensive metabolic panel   Insulin and C-Peptide   Glutamic acid decarboxylase auto abs   POCT glycosylated hemoglobin (Hb A1C)     Patient instructions: We will request latest eye exam (diabetic) from eye doctor in [place] today.  Start metformin XR '500mg'$  once daily with breakfast.  Start basal insulin 10 units daily in the evening, with titration by 3 units every 3 days if fasting sugar averaging >150.  Check with pharmacy/insurance on preferred basal insulin brand and preferred glucometer brand.  Return in 1 month for follow  up visit, keep me updated via MyChart.  Consider diabetes classes.   Follow up plan: Return in about 4 weeks (around 11/25/2021) for follow up visit.  Ria Bush, MD

## 2021-10-28 NOTE — Assessment & Plan Note (Addendum)
Update FLP. Will need to discuss statin indication.  The ASCVD Risk score (Arnett DK, et al., 2019) failed to calculate for the following reasons:   Unable to determine if patient is Non-Hispanic African American

## 2021-10-28 NOTE — Assessment & Plan Note (Signed)
BP borderline elevated today. Reassess at f/u

## 2021-10-28 NOTE — Assessment & Plan Note (Addendum)
Significant weight loss over the past year in setting of uncontrolled diabetes. Will work towards better glycemic control then reassess.

## 2021-10-28 NOTE — Assessment & Plan Note (Addendum)
Significant progression since last A1c (02/2020) now with marked hyperglycemia as well as weight loss (?catabolic state). Reviewed pathophysiology of type 1 vs 2 diabetes as wellaas treatment options. Given marked A1c elevation, start basal insulin (Semglee seems to be insurance preference) as well as metformin XR '500mg'$  daily. Reviewed mechanism of action of medication as well as side effects to watch for. Reviewed need for yearly eye exam as well as monitoring feet regularly. Offered diabetes education classes, he defers for now. Recommend start semglee 10u nightly, with slow titration over the next few weeks (3u every 3 days if average fasting sugar >150). RTC 1 mo DM f/u visit.  Check labs today including GAD autoantibody and insulin/C-peptide level to evaluate insulin resistance vs deficiency.

## 2021-10-28 NOTE — Assessment & Plan Note (Addendum)
Will need to verify CPAP use - as this may have improved with weight loss

## 2021-10-30 ENCOUNTER — Telehealth: Payer: Self-pay | Admitting: Family Medicine

## 2021-10-30 ENCOUNTER — Telehealth: Payer: Self-pay

## 2021-10-30 DIAGNOSIS — E1165 Type 2 diabetes mellitus with hyperglycemia: Secondary | ICD-10-CM

## 2021-10-30 LAB — INSULIN AND C-PEPTIDE, SERUM
C-Peptide: 2.1 ng/mL (ref 1.1–4.4)
INSULIN: 6.3 u[IU]/mL (ref 2.6–24.9)

## 2021-10-30 LAB — GLUTAMIC ACID DECARBOXYLASE AUTO ABS: Glutamic Acid Decarb Ab: <5 IU/mL (ref <5)

## 2021-10-30 MED ORDER — ONETOUCH ULTRA 2 W/DEVICE KIT: PACK | 0 refills | Status: AC

## 2021-10-30 MED ORDER — ONETOUCH DELICA PLUS LANCET33G MISC: 0 refills | Status: AC

## 2021-10-30 MED ORDER — ONETOUCH ULTRA VI STRP: ORAL_STRIP | 0 refills | Status: AC

## 2021-10-30 NOTE — Telephone Encounter (Signed)
Lvm asking pt to call back. Need to see if pt contacted his ins co to see what brand basal insulin they cover (see 10/28/21 OV notes).  Pt was also to let us know about what brand/model glucometer they cover.

## 2021-10-30 NOTE — Telephone Encounter (Signed)
E-scribed rx for OneTouch Ultra 2 meter kit, OneTouch Ultra test strips and OneTouch Delica Plus lancets 33 G.

## 2021-10-30 NOTE — Telephone Encounter (Signed)
Received following message from pharmacy:  Alternative Requested:SEMGLEE NOT ON FORMULARY.  Plz submit PA for Semglee insulin 100 unit/mL.

## 2021-10-30 NOTE — Telephone Encounter (Signed)
Pt called stating he was told to let Dr. Darnell Level know what Glucose brand & glucometer brand. glucometer is one touch ultra two. Glucose is Semglee 10. Call back # [phone]

## 2021-10-30 NOTE — Telephone Encounter (Addendum)
PA will be submitted.  (See 10/30/21 phn note.)  Looks like per pt's ins co, he must use [employer].  Rx is in the process of being transferred.

## 2021-10-31 NOTE — Telephone Encounter (Signed)
Prior auth for [employer] (yfgn) 100UNIT/ML pen-injectors [place]: [id number] I initially started a prior auth for Insulin Glargine-yfgn 100UNIT/ML pen-injectors.  This stated the case had been denied based on patient's profile with insurance.  I called [facility] to see why the case had been denied.  They stated that Semglee is the preferred brand and would be approved.  I completed a PA for [facility] which states that drug is covered by benefit plan and that no PA is required.  I called [facility] to notify them what CMM said.  They stated they tried to send through the medication and it says:   Not covered at location.  He said that normally means that it will not be covered at any [facility].   Notified patient via mychart that he will need to contact his insurance company to see where they will approve this medication and then let Lattie Haw know.

## 2021-11-04 NOTE — Telephone Encounter (Signed)
Noted  

## 2021-11-04 NOTE — Telephone Encounter (Signed)
Spoke with pt to see if he picked up rx.  Confirms he did get it from [facility].  Says [facility] no longer uses [facility] and request his chart be updated.  Updated pt's pharmacy list and c/x request.

## 2021-11-17 ENCOUNTER — Encounter: Payer: Self-pay | Admitting: Family Medicine

## 2021-11-17 DIAGNOSIS — Z794 Long term (current) use of insulin: Secondary | ICD-10-CM

## 2021-11-28 ENCOUNTER — Ambulatory Visit (INDEPENDENT_AMBULATORY_CARE_PROVIDER_SITE_OTHER): Payer: No Typology Code available for payment source | Admitting: Family Medicine

## 2021-11-28 ENCOUNTER — Encounter: Payer: Self-pay | Admitting: Family Medicine

## 2021-11-28 VITALS — BP 132/84 | HR 72 | Temp 97.3°F | Ht 69.5 in | Wt 192.2 lb

## 2021-11-28 DIAGNOSIS — R5383 Other fatigue: Secondary | ICD-10-CM | POA: Diagnosis not present

## 2021-11-28 DIAGNOSIS — E1169 Type 2 diabetes mellitus with other specified complication: Secondary | ICD-10-CM

## 2021-11-28 DIAGNOSIS — E785 Hyperlipidemia, unspecified: Secondary | ICD-10-CM

## 2021-11-28 DIAGNOSIS — Z794 Long term (current) use of insulin: Secondary | ICD-10-CM

## 2021-11-28 DIAGNOSIS — E1165 Type 2 diabetes mellitus with hyperglycemia: Secondary | ICD-10-CM | POA: Diagnosis not present

## 2021-11-28 LAB — POCT GLUCOSE (DEVICE FOR HOME USE)

## 2021-11-28 LAB — GLUCOSE, POCT (MANUAL RESULT ENTRY): POC Glucose: 308 mg/dl — AB (ref 70–99)

## 2021-11-28 MED ORDER — FREESTYLE LIBRE 3 SENSOR MISC: 11 refills | Status: DC

## 2021-11-28 MED ORDER — INSULIN GLARGINE-YFGN 100 UNIT/ML ~~LOC~~ SOPN: 20.0000 [IU] | PEN_INJECTOR | Freq: Every day | SUBCUTANEOUS | 1 refills | Status: DC

## 2021-11-28 MED ORDER — METFORMIN HCL ER 500 MG PO TB24: 1000.0000 mg | ORAL_TABLET | Freq: Every day | ORAL | 2 refills | Status: DC

## 2021-11-28 NOTE — Assessment & Plan Note (Addendum)
Discussed statin indication. He prefers to avoid starting at this time. The 10-year ASCVD risk score (Arnett DK, et al., 2019) is: 4.8%   Values used to calculate the score:     Age: 41 years     Sex: Male     Is Non-Hispanic African American: No     Diabetic: Yes     Tobacco smoker: No     Systolic Blood Pressure: 366 mmHg     Is BP treated: No     HDL Cholesterol: 47.9 mg/dL     Total Cholesterol: 242 mg/dL

## 2021-11-28 NOTE — Patient Instructions (Addendum)
Quicken insulin titration to 3 units every 2 days if averaging >150 fasting sugar reading. As we get closer to goal range, slow down titration again.  Increase metformin to 2 tablets in the morning.  Price out [employer] 3. Sample provided today.  Good to see you today.  Return in 4-5 weeks for follow up  visit.

## 2021-11-28 NOTE — Addendum Note (Signed)
Addended by: Brenton Grills on: 09/32/6712 04:35 PM   Modules accepted: Orders

## 2021-11-28 NOTE — Progress Notes (Signed)
Patient ID: Linkon Siverson, male    DOB: [birth year], 41 y.o.   MRN: [id number]  This visit was conducted in person.  BP 132/84   Pulse 72   Temp (!) 97.3 F (36.3 C) (Temporal)   Ht 5' 9.5" (1.765 m)   Wt 192 lb 4 oz (87.2 kg)   SpO2 98%   BMI 27.98 kg/m    CC: DM f/u visit  Subjective:   HPI: Gerhardt Gleed is a 41 y.o. male presenting on 11/28/2021 for Diabetes (Here for 1 mo f/u.  Pt brought in home glucometer to compare.  Reading in office today- 274.  Pt last ate about 10:00 this AM. )   Notes some decreased fatigue, sex drive, and depressed mood. Asks about low T.   DM - does regularly check fasting sugars averaging 220s. Compliant with antihyperglycemic regimen which includes: semglee titration currently on 19 units QAM, metformin XR 547m daily. Continues following low carb diet. Denies low sugars or hypoglycemic symptoms. Denies paresthesias, blurry vision. Last diabetic eye exam due for this. Glucometer brand: one touch ultra 2. Last foot exam: 10/2021. DSME: has declined. Lab Results  Component Value Date   HGBA1C 12.7 (A) 10/28/2021   Diabetic Foot Exam - Simple   No data filed    Lab Results  Component Value Date   MICROALBUR 1.4 02/21/2020         Relevant past medical, surgical, family and social history reviewed and updated as indicated. Interim medical history since our last visit reviewed. Allergies and medications reviewed and updated. Outpatient Medications Prior to Visit  Medication Sig Dispense Refill   B Complex Vitamins (VITAMIN B COMPLEX PO) Take by mouth.     Blood Glucose Monitoring Suppl (ONE TOUCH ULTRA 2) w/Device KIT Use as instructed to check blood sugar once a day 1 kit 0   Cholecalciferol (VITAMIN D3) 25 MCG (1000 UT) CAPS Take 1 capsule (1,000 Units total) by mouth daily. During winter months 30 capsule    fluticasone (FLONASE) 50 MCG/ACT nasal spray Place 2 sprays into both nostrils daily as needed for allergies or rhinitis.      glucose blood (ONETOUCH ULTRA) test strip Use as instructed to check blood sugar once a day 100 each 0   Lancets (ONETOUCH DELICA PLUS [id number] MISC Use as instructed to check blood sugar once a day 100 each 0   loratadine (CLARITIN) 10 MG tablet Take 10 mg by mouth daily.     Probiotic Product (PROBIOTIC DAILY PO) Take by mouth daily.     insulin glargine-yfgn (SEMGLEE, YFGN,) 100 UNIT/ML Pen Inject 10 Units into the skin daily. 3 mL 1   metFORMIN (GLUCOPHAGE-XR) 500 MG 24 hr tablet Take 1 tablet (500 mg total) by mouth daily with breakfast. 30 tablet 6   cetirizine (ZYRTEC) 10 MG tablet Take 1 tablet (10 mg total) by mouth daily as needed for allergies.     No facility-administered medications prior to visit.     Per HPI unless specifically indicated in ROS section below Review of Systems  Objective:  BP 132/84   Pulse 72   Temp (!) 97.3 F (36.3 C) (Temporal)   Ht 5' 9.5" (1.765 m)   Wt 192 lb 4 oz (87.2 kg)   SpO2 98%   BMI 27.98 kg/m   Wt Readings from Last 3 Encounters:  11/28/21 192 lb 4 oz (87.2 kg)  10/28/21 191 lb 4 oz (86.8 kg)  02/21/20 224 lb 1 oz (101.6 kg)  Physical Exam Vitals and nursing note reviewed.  Constitutional:      Appearance: Normal appearance. He is not ill-appearing.  HENT:     Mouth/Throat:     Mouth: Mucous membranes are moist.     Pharynx: Oropharynx is clear. No oropharyngeal exudate or posterior oropharyngeal erythema.  Neck:     Thyroid: No thyroid mass or thyromegaly.  Cardiovascular:     Rate and Rhythm: Normal rate and regular rhythm.     Pulses: Normal pulses.     Heart sounds: Normal heart sounds. No murmur heard. Pulmonary:     Effort: Pulmonary effort is normal. No respiratory distress.     Breath sounds: Normal breath sounds. No wheezing, rhonchi or rales.  Musculoskeletal:     Right lower leg: No edema.     Left lower leg: No edema.  Skin:    General: Skin is warm and dry.     Findings: No rash.  Neurological:      Mental Status: He is alert.  Psychiatric:        Mood and Affect: Mood normal.        Behavior: Behavior normal.       Results for orders placed or performed in visit on 11/28/21  POCT Glucose (Device for Home Use)  Result Value Ref Range   Glucose Fasting, POC 308 (A) 70 - 99 mg/dL   POC Glucose 308 (A) 70 - 99 mg/dl    Assessment & Plan:   Problem List Items Addressed This Visit     Dyslipidemia associated with type 2 diabetes mellitus ([place])    Discussed statin indication. He prefers to avoid starting at this time. The 10-year ASCVD risk score (Arnett DK, et al., 2019) is: 4.8%   Values used to calculate the score:     Age: 23 years     Sex: Male     Is Non-Hispanic African American: No     Diabetic: Yes     Tobacco smoker: No     Systolic Blood Pressure: 505 mmHg     Is BP treated: No     HDL Cholesterol: 47.9 mg/dL     Total Cholesterol: 242 mg/dL       Relevant Medications   metFORMIN (GLUCOPHAGE-XR) 500 MG 24 hr tablet   insulin glargine-yfgn (SEMGLEE, YFGN,) 100 UNIT/ML Pen   Type 2 diabetes mellitus with hyperglycemia, with long-term current use of insulin (HCC) - Primary    Slow improvement, tolerating medications well. He just recently started titrating insulin, is on 19u per day. Discussed speeding titration to 3u every 2 days until fasting cbg averaging <200 then drop to 3u every 3 days or 2u every 3 days until fasting cbg goal reached of <150.  Will also increase metformin XR to 1076m daily.  Reviewed glucometer data.  Discussed benefits of continuous glucose monitoring - provided with Freestyle Libre 3 sample, Rx sent to pharmacy. RTC 4-5 wks f/u visit.       Relevant Medications   metFORMIN (GLUCOPHAGE-XR) 500 MG 24 hr tablet   insulin glargine-yfgn (SEMGLEE, YFGN,) 100 UNIT/ML Pen   Other Relevant Orders   POCT Glucose (Device for Home Use) (Completed)   Fatigue    Notes some fatigue, occ depressed mood, decreased libido - check T next labwork.          Meds ordered this encounter  Medications   metFORMIN (GLUCOPHAGE-XR) 500 MG 24 hr tablet    Sig: Take 2 tablets (1,000 mg total) by mouth  daily with breakfast.    Dispense:  180 tablet    Refill:  2   insulin glargine-yfgn (SEMGLEE, YFGN,) 100 UNIT/ML Pen    Sig: Inject 20 Units into the skin daily.    Dispense:  15 mL    Refill:  1    Formulate based on affordability/insurance preference/formulary   Continuous Blood Gluc Sensor (FREESTYLE LIBRE 3 SENSOR) MISC    Sig: Place 1 sensor on the skin every 14 days. Use to check glucose continuously    Dispense:  2 each    Refill:  11   Orders Placed This Encounter  Procedures   POCT Glucose (Device for Home Use)     Patient Instructions  Quicken insulin titration to 3 units every 2 days if averaging >150 fasting sugar reading. As we get closer to goal range, slow down titration again.  Increase metformin to 2 tablets in the morning.  Price out [employer] 3. Sample provided today.  Good to see you today.  Return in 4-5 weeks for follow up  visit.   Follow up plan: Return in about 4 weeks (around 12/26/2021), or if symptoms worsen or fail to improve, for follow up visit.  Ria Bush, MD

## 2021-11-28 NOTE — Assessment & Plan Note (Addendum)
Notes some fatigue, occ depressed mood, decreased libido - check T next labwork.

## 2021-11-28 NOTE — Assessment & Plan Note (Signed)
Slow improvement, tolerating medications well. He just recently started titrating insulin, is on 19u per day. Discussed speeding titration to 3u every 2 days until fasting cbg averaging <200 then drop to 3u every 3 days or 2u every 3 days until fasting cbg goal reached of <150.  Will also increase metformin XR to '1000mg'$  daily.  Reviewed glucometer data.  Discussed benefits of continuous glucose monitoring - provided with Freestyle Libre 3 sample, Rx sent to pharmacy. RTC 4-5 wks f/u visit.

## 2021-12-02 ENCOUNTER — Telehealth: Payer: Self-pay | Admitting: Family Medicine

## 2021-12-02 NOTE — Telephone Encounter (Signed)
Hello! The address has been added.

## 2021-12-17 MED ORDER — BD PEN NEEDLE NANO U/F 32G X 4 MM MISC: 3 refills | Status: DC

## 2021-12-17 NOTE — Telephone Encounter (Signed)
E-scribed pen needles.

## 2021-12-17 NOTE — Addendum Note (Signed)
Addended by: Brenton Grills on: [id number] 08:06 AM   Modules accepted: Orders

## 2021-12-24 LAB — HM DIABETES EYE EXAM

## 2021-12-30 ENCOUNTER — Encounter: Payer: Self-pay | Admitting: Family Medicine

## 2021-12-30 ENCOUNTER — Encounter: Payer: Self-pay | Admitting: Optometry

## 2021-12-30 ENCOUNTER — Ambulatory Visit (INDEPENDENT_AMBULATORY_CARE_PROVIDER_SITE_OTHER): Payer: No Typology Code available for payment source | Admitting: Family Medicine

## 2021-12-30 VITALS — BP 142/84 | HR 69 | Ht 69.5 in | Wt 205.0 lb

## 2021-12-30 DIAGNOSIS — Z794 Long term (current) use of insulin: Secondary | ICD-10-CM

## 2021-12-30 DIAGNOSIS — I10 Essential (primary) hypertension: Secondary | ICD-10-CM | POA: Diagnosis not present

## 2021-12-30 DIAGNOSIS — E1165 Type 2 diabetes mellitus with hyperglycemia: Secondary | ICD-10-CM | POA: Diagnosis not present

## 2021-12-30 NOTE — Assessment & Plan Note (Signed)
BP mildly elevated again today, he's not on medication.

## 2021-12-30 NOTE — Patient Instructions (Addendum)
Continue current regimen including metformin XR '500mg'$  twice daily and Semglee 34 u daily, with option to continue slow taper for goal fasting sugar range 80-120.  Return in 1 month for lab visit only (fingerstick A1c).  Return in 4 months for physical.  Good to see you today We will consider weekly injectable medication at next visit.

## 2021-12-30 NOTE — Progress Notes (Signed)
Patient ID: Alan Barber, male    DOB: August 29, 1980, 41 y.o.   MRN: [id number]  This visit was conducted in person.  BP (!) 142/84   Pulse 69   Ht 5' 9.5" (1.765 m)   Wt 205 lb (93 kg)   SpO2 98%   BMI 29.84 kg/m    CC: DM f/u visit  Subjective:   HPI: Alan Barber is a 41 y.o. male presenting on 12/30/2021 for Diabetes (Follow up)   DM - does regularly check sugars fasting 140-150s, postprandial 170s. Compliant with antihyperglycemic regimen which includes: metformin XR 566m bid as well as semglee insulin, currently on 34u. 10071mmetformin XR caused GI upset. Denies low sugars or hypoglycemic symptoms. Denies paresthesias, blurry vision. [facility]com[facility] brand: Freestyle Libre 3. Last foot exam: 10/2021. DSME: none. Lab Results  Component Value Date   HGBA1C 12.7 (A) 10/28/2021   Diabetic Foot Exam - Simple   No data filed    Lab Results  Component Value Date   MICROALBUR 1.4 02/21/2020         Relevant past medical, surgical, family and social history reviewed and updated as indicated. Interim medical history since our last visit reviewed. Allergies and medications reviewed and updated. Outpatient Medications Prior to Visit  Medication Sig Dispense Refill   Insulin Pen Needle (BD PEN NEEDLE NANO U/F) 32G X 4 MM MISC Use to inject insulin daily 100 each 3   B Complex Vitamins (VITAMIN B COMPLEX PO) Take by mouth.     Blood Glucose Monitoring Suppl (ONE TOUCH ULTRA 2) w/Device KIT Use as instructed to check blood sugar once a day 1 kit 0   Cholecalciferol (VITAMIN D3) 25 MCG (1000 UT) CAPS Take 1 capsule (1,000 Units total) by mouth daily. During winter months 30 capsule    Continuous Blood Gluc Sensor (FREESTYLE LIBRE 3 SENSOR) MISC Place 1 sensor on the skin every 14 days. Use to check glucose continuously 2 each 11   fluticasone (FLONASE) 50 MCG/ACT nasal spray Place 2 sprays into both nostrils daily as needed for  allergies or rhinitis.     glucose blood (ONETOUCH ULTRA) test strip Use as instructed to check blood sugar once a day 100 each 0   insulin glargine-yfgn (SEMGLEE, YFGN,) 100 UNIT/ML Pen Inject 34 Units into the skin daily. 15 mL    Lancets (ONETOUCH DELICA PLUS [id number] Use as instructed to check blood sugar once a day 100 each 0   loratadine (CLARITIN) 10 MG tablet Take 10 mg by mouth daily.     metFORMIN (GLUCOPHAGE-XR) 500 MG 24 hr tablet Take 2 tablets (1,000 mg total) by mouth daily with breakfast. 180 tablet 2   Probiotic Product (PROBIOTIC DAILY PO) Take by mouth daily.     insulin glargine-yfgn (SEMGLEE, YFGN,) 100 UNIT/ML Pen Inject 20 Units into the skin daily. 15 mL 1   No facility-administered medications prior to visit.     Per HPI unless specifically indicated in ROS section below Review of Systems  Objective:  BP (!) 142/84   Pulse 69   Ht 5' 9.5" (1.765 m)   Wt 205 lb (93 kg)   SpO2 98%   BMI 29.84 kg/m   Wt Readings from Last 3 Encounters:  12/30/21 205 lb (93 kg)  11/28/21 192 lb 4 oz (87.2 kg)  10/28/21 191 lb 4 oz (86.8 kg)      Physical Exam Vitals and nursing note reviewed.  Constitutional:      Appearance: Normal appearance. He is not ill-appearing.  HENT:     Head: Normocephalic and atraumatic.     Mouth/Throat:     Mouth: Mucous membranes are moist. Oral lesions present.     Pharynx: Oropharynx is clear. No oropharyngeal exudate or posterior oropharyngeal erythema.      Comments: Small ulcer to R tonsil, in h/o canker sores Eyes:     Extraocular Movements: Extraocular movements intact.     Pupils: Pupils are equal, round, and reactive to light.  Cardiovascular:     Rate and Rhythm: Normal rate and regular rhythm.     Pulses: Normal pulses.     Heart sounds: Normal heart sounds. No murmur heard. Pulmonary:     Effort: Pulmonary effort is normal. No respiratory distress.     Breath sounds: Normal breath sounds. No wheezing, rhonchi or  rales.  Musculoskeletal:     Right lower leg: No edema.     Left lower leg: No edema.  Skin:    General: Skin is warm and dry.     Findings: No rash.  Neurological:     Mental Status: He is alert.  Psychiatric:        Mood and Affect: Mood normal.        Behavior: Behavior normal.       Results for orders placed or performed in visit on 11/28/21  POCT Glucose (Device for Home Use)  Result Value Ref Range   Glucose Fasting, POC     POC Glucose    POCT glucose (manual entry)  Result Value Ref Range   POC Glucose 308 (A) 70 - 99 mg/dl    Assessment & Plan:   Problem List Items Addressed This Visit       Unprioritized   Primary hypertension    BP mildly elevated again today, he's not on medication.       Type 2 diabetes mellitus with hyperglycemia, with long-term current use of insulin (HCC) - Primary    Chronic, improved control based on recent sugar readings.  Continue current regimen including Semglee 34u daily and Metformin XR 534m BID - has difficulty tolerating 10068mat a time.  Discussed weekly GLP1RA - may start this next visit.  Encouraged he start using Freestyle Libre 3.  I've asked him to return in 1 month for POC A1c and 4 months for CPE.       Relevant Medications   insulin glargine-yfgn (SEMGLEE, YFGN,) 100 UNIT/ML Pen   Other Relevant Orders   POCT glycosylated hemoglobin (Hb A1C)     No orders of the defined types were placed in this encounter.  Orders Placed This Encounter  Procedures   POCT glycosylated hemoglobin (Hb A1C)    Standing Status:   Future    Standing Expiration Date:   04/01/2022     Patient Instructions  Continue current regimen including metformin XR 50034mwice daily and Semglee 34 u daily, with option to continue slow taper for goal fasting sugar range 80-120.  Return in 1 month for lab visit only (fingerstick A1c).  Return in 4 months for physical.  Good to see you today We will consider weekly injectable medication at  next visit.  Follow up plan: Return in about 4 months (around 04/30/2022) for annual exam, prior fasting for blood work.  Alan Barber

## 2021-12-30 NOTE — Assessment & Plan Note (Addendum)
Chronic, improved control based on recent sugar readings.  Continue current regimen including Semglee 34u daily and Metformin XR '500mg'$  BID - has difficulty tolerating '1000mg'$  at a time.  Discussed weekly GLP1RA - may start this next visit.  Encouraged he start using Freestyle Libre 3.  I've asked him to return in 1 month for POC A1c and 4 months for CPE.

## 2022-01-29 ENCOUNTER — Other Ambulatory Visit (INDEPENDENT_AMBULATORY_CARE_PROVIDER_SITE_OTHER): Payer: No Typology Code available for payment source

## 2022-01-29 DIAGNOSIS — E1165 Type 2 diabetes mellitus with hyperglycemia: Secondary | ICD-10-CM | POA: Diagnosis not present

## 2022-01-29 DIAGNOSIS — Z794 Long term (current) use of insulin: Secondary | ICD-10-CM

## 2022-01-29 LAB — POCT GLYCOSYLATED HEMOGLOBIN (HGB A1C): Hemoglobin A1C: 8.7 % — AB (ref 4.0–5.6)

## 2022-02-03 ENCOUNTER — Other Ambulatory Visit: Payer: Self-pay | Admitting: Family Medicine

## 2022-02-03 ENCOUNTER — Telehealth: Payer: Self-pay | Admitting: Family Medicine

## 2022-02-06 NOTE — Telephone Encounter (Signed)
Semglee Last filled: 12/08/21, #15 mL Last OV: 12/30/21, DM f/u Next OV: 05/01/22, CPE

## 2022-02-08 NOTE — Telephone Encounter (Signed)
ERx at 34u daily dose

## 2022-02-09 ENCOUNTER — Other Ambulatory Visit: Payer: No Typology Code available for payment source

## 2022-02-09 NOTE — Telephone Encounter (Signed)
Pt need's refilled on RX insulin glargine-yfgn (SEMGLEE, YFGN,) 100 UNIT/ML Pen  . Stated RX needs to be in a 90 day supply for insurance to cover it . Please advise [phone]

## 2022-02-10 MED ORDER — INSULIN GLARGINE-YFGN 100 UNIT/ML ~~LOC~~ SOPN: 34.0000 [IU] | PEN_INJECTOR | Freq: Every day | SUBCUTANEOUS | 1 refills | Status: DC

## 2022-02-10 NOTE — Telephone Encounter (Signed)
I sent med in for 3 month supply to [facility] pharmacy 2 days ago. Please call pharmacy to see what the issue is thanks.

## 2022-02-10 NOTE — Telephone Encounter (Addendum)
Last dose was 88d supply. I will send in 132 day supply this time as I cannot send 90d supply given how insulin is dispensed.

## 2022-02-10 NOTE — Telephone Encounter (Signed)
Called and spoke to pharmacist at [facility] and was advised that they received and filled a 30 day supply for the patient. Pharmacist stated that a lot of the insurance companies require a 90 days supply which patient's co-pay is a lot cheaper. Pharmacist requested that a 90 days supply be sent to them.

## 2022-02-10 NOTE — Addendum Note (Signed)
Addended by: Ria Bush on: 02/10/2022 05:14 PM   Modules accepted: Orders

## 2022-02-10 NOTE — Telephone Encounter (Signed)
Rx already sent on 02/08/22, #30 mL/1.

## 2022-02-11 NOTE — Telephone Encounter (Signed)
Called and spoke to [place] the pharmacist at [facility] and was advised that the medication is not on back order. Ronalee Belts stated that they did not have enough in stock to fill the prescription that was sent in. Ronalee Belts stated that they got the medication in today and the prescription is ready for patient to pick up. Called patient and explained to him that the medication is not on back order and what the pharmacist advised.  Patient stated that he just got a message that the pharmacy that his prescription is ready for pickup.

## 2022-02-11 NOTE — Telephone Encounter (Signed)
Pt called in stated he was not able to get his medication . Was told by pharmacy it's still on back order

## 2022-02-11 NOTE — Telephone Encounter (Signed)
Left message on voicemail for patient to call the office back. Need to make sure patient was able to get his medication.

## 2022-02-16 ENCOUNTER — Encounter: Payer: No Typology Code available for payment source | Admitting: Family Medicine

## 2022-04-22 ENCOUNTER — Other Ambulatory Visit: Payer: Self-pay | Admitting: Family Medicine

## 2022-04-22 DIAGNOSIS — E1169 Type 2 diabetes mellitus with other specified complication: Secondary | ICD-10-CM

## 2022-04-22 DIAGNOSIS — E1165 Type 2 diabetes mellitus with hyperglycemia: Secondary | ICD-10-CM

## 2022-04-23 ENCOUNTER — Other Ambulatory Visit (INDEPENDENT_AMBULATORY_CARE_PROVIDER_SITE_OTHER): Payer: No Typology Code available for payment source

## 2022-04-23 DIAGNOSIS — Z794 Long term (current) use of insulin: Secondary | ICD-10-CM

## 2022-04-23 DIAGNOSIS — E1169 Type 2 diabetes mellitus with other specified complication: Secondary | ICD-10-CM

## 2022-04-23 DIAGNOSIS — E1165 Type 2 diabetes mellitus with hyperglycemia: Secondary | ICD-10-CM

## 2022-04-23 DIAGNOSIS — E785 Hyperlipidemia, unspecified: Secondary | ICD-10-CM

## 2022-04-23 LAB — COMPREHENSIVE METABOLIC PANEL
ALT: 18 U/L (ref 0–53)
AST: 17 U/L (ref 0–37)
Albumin: 4.7 g/dL (ref 3.5–5.2)
Alkaline Phosphatase: 62 U/L (ref 39–117)
BUN: 13 mg/dL (ref 6–23)
CO2: 33 mEq/L — ABNORMAL HIGH (ref 19–32)
Calcium: 9.6 mg/dL (ref 8.4–10.5)
Chloride: 99 mEq/L (ref 96–112)
Creatinine, Ser: 1.15 mg/dL (ref 0.40–1.50)
GFR: 77.91 mL/min (ref >60.00)
Glucose, Bld: 104 mg/dL — ABNORMAL HIGH (ref 70–99)
Potassium: 4.1 mEq/L (ref 3.5–5.1)
Sodium: 138 mEq/L (ref 135–145)
Total Bilirubin: 0.7 mg/dL (ref 0.2–1.2)
Total Protein: 7.5 g/dL (ref 6.0–8.3)

## 2022-04-23 LAB — LIPID PANEL
Cholesterol: 189 mg/dL (ref 0–200)
HDL: 53.2 mg/dL (ref >39.00)
LDL Cholesterol: 119 mg/dL — ABNORMAL HIGH (ref 0–99)
NonHDL: 136.08
Total CHOL/HDL Ratio: 4
Triglycerides: 84 mg/dL (ref 0.0–149.0)
VLDL: 16.8 mg/dL (ref 0.0–40.0)

## 2022-04-23 LAB — MICROALBUMIN / CREATININE URINE RATIO
Creatinine,U: 329.1 mg/dL
Microalb Creat Ratio: 0.9 mg/g (ref 0.0–30.0)
Microalb, Ur: 2.9 mg/dL — ABNORMAL HIGH (ref 0.0–1.9)

## 2022-04-23 LAB — HEMOGLOBIN A1C: Hgb A1c MFr Bld: 7.1 % — ABNORMAL HIGH (ref 4.6–6.5)

## 2022-04-23 LAB — TSH: TSH: 1.47 u[IU]/mL (ref 0.35–5.50)

## 2022-05-01 ENCOUNTER — Encounter: Payer: No Typology Code available for payment source | Admitting: Family Medicine

## 2022-05-04 ENCOUNTER — Encounter: Payer: Self-pay | Admitting: Family Medicine

## 2022-05-04 ENCOUNTER — Ambulatory Visit (INDEPENDENT_AMBULATORY_CARE_PROVIDER_SITE_OTHER): Payer: No Typology Code available for payment source | Admitting: Family Medicine

## 2022-05-04 VITALS — BP 120/74 | HR 68 | Temp 97.9°F | Ht 69.5 in | Wt 212.0 lb

## 2022-05-04 DIAGNOSIS — E66811 Obesity, class 1: Secondary | ICD-10-CM

## 2022-05-04 DIAGNOSIS — E1169 Type 2 diabetes mellitus with other specified complication: Secondary | ICD-10-CM

## 2022-05-04 DIAGNOSIS — R197 Diarrhea, unspecified: Secondary | ICD-10-CM

## 2022-05-04 DIAGNOSIS — G4733 Obstructive sleep apnea (adult) (pediatric): Secondary | ICD-10-CM | POA: Diagnosis not present

## 2022-05-04 DIAGNOSIS — Z0000 Encounter for general adult medical examination without abnormal findings: Secondary | ICD-10-CM

## 2022-05-04 DIAGNOSIS — Z794 Long term (current) use of insulin: Secondary | ICD-10-CM

## 2022-05-04 DIAGNOSIS — I10 Essential (primary) hypertension: Secondary | ICD-10-CM

## 2022-05-04 DIAGNOSIS — E1165 Type 2 diabetes mellitus with hyperglycemia: Secondary | ICD-10-CM

## 2022-05-04 DIAGNOSIS — K9189 Other postprocedural complications and disorders of digestive system: Secondary | ICD-10-CM

## 2022-05-04 DIAGNOSIS — E785 Hyperlipidemia, unspecified: Secondary | ICD-10-CM

## 2022-05-04 DIAGNOSIS — E669 Obesity, unspecified: Secondary | ICD-10-CM | POA: Diagnosis not present

## 2022-05-04 MED ORDER — FREESTYLE LIBRE 3 SENSOR MISC: 3 refills | Status: DC

## 2022-05-04 NOTE — Patient Instructions (Addendum)
Good to see you today. 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  You are doing well today  Return as needed or in 6 months for diabetes follow up visit.

## 2022-05-04 NOTE — Assessment & Plan Note (Signed)
Daily probiotic helps  To consider bile acid sequestrant.

## 2022-05-04 NOTE — Assessment & Plan Note (Addendum)
Chronic, LDL improved however above goal. Discussed statin indication. He will focus on healthy diet changes. Discussed possible bile acid sequestrant.  The 10-year ASCVD risk score (Arnett DK, et al., 2019) is: 2.3%   Values used to calculate the score:     Age: 42 years     Sex: Male     Is Non-Hispanic African American: No     Diabetic: Yes     Tobacco smoker: No     Systolic Blood Pressure: 123456 mmHg     Is BP treated: No     HDL Cholesterol: 53.2 mg/dL     Total Cholesterol: 189 mg/dL

## 2022-05-04 NOTE — Assessment & Plan Note (Signed)
Chronic, stable off medication.  

## 2022-05-04 NOTE — Assessment & Plan Note (Signed)
Chronic, continued improvement noted with latest A1c down to 7.1%.  Continue current regimen.  He's had difficulty tolerating higher metformin XR dose - will stay at 500mg  daily dose.  Continue Semglee 25-28u daily.

## 2022-05-04 NOTE — Progress Notes (Signed)
Patient ID: Alan Barber, male    DOB: Nov 28, 1980, 42 y.o.   MRN: [id number]  This visit was conducted in person.  BP 120/74 (BP Location: Right Arm, Patient Position: Sitting)   Pulse 68   Temp 97.9 F (36.6 C) (Skin)   Ht 5' 9.5" (1.765 m)   Wt 212 lb (96.2 kg)   SpO2 98%   BMI 30.86 kg/m    CC: CPE Subjective:   HPI: Alan Barber is a 42 y.o. male presenting on 05/04/2022 for Annual Exam   OSA - now on CPAP, saw Dr Lamonte Sakai. AutoPAP setting. [facility].    S/p cholecystectomy 2017 - notes ongoing loose stools since then.   DM - trouble tolerating metformin even XR at 1000mg  daily, overall tolerating 500mg  daily. Has been able to dropped semglee dose to ~25 units daily with persistent good control. Following diabetic diet.   Preventative: Flu shot - declined COVID vaccine - discussed, declined Pneumococcal - declines Td 2008, Tdap 02/2019  Seat belt use discussed. Sunscreen use discussed, no changing moles on skin.  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 Interim medical history since our last visit reviewed. Allergies and medications reviewed and updated. Outpatient Medications Prior to Visit  Medication Sig Dispense Refill   B Complex Vitamins (VITAMIN B COMPLEX PO) Take by mouth.     Blood Glucose Monitoring Suppl (ONE TOUCH ULTRA 2) w/Device KIT Use as instructed to check blood sugar once a day 1 kit 0   Cholecalciferol (VITAMIN D3) 25 MCG (1000 UT) CAPS Take 1 capsule (1,000 Units total) by mouth daily. During winter months 30  capsule    fluticasone (FLONASE) 50 MCG/ACT nasal spray Place 2 sprays into both nostrils daily as needed for allergies or rhinitis.     glucose blood (ONETOUCH ULTRA) test strip Use as instructed to check blood sugar once a day 100 each 0   Insulin Pen Needle (BD PEN NEEDLE NANO U/F) 32G X 4 MM MISC Use to inject insulin daily 100 each 3   Lancets (ONETOUCH DELICA PLUS 123XX123) MISC Use as instructed to check blood sugar once a day 100 each 0   loratadine (CLARITIN) 10 MG tablet Take 10 mg by mouth daily.     Probiotic Product (PROBIOTIC DAILY PO) Take by mouth daily.     Continuous Blood Gluc Sensor (FREESTYLE LIBRE 3 SENSOR) MISC Place 1 sensor on the skin every 14 days. Use to check glucose continuously 2 each 11   insulin glargine-yfgn (SEMGLEE, YFGN,) 100 UNIT/ML Pen Inject 34 Units into the skin daily. (Patient taking differently: Inject 28 Units into the skin daily.) 45 mL 1   metFORMIN (GLUCOPHAGE-XR) 500 MG 24 hr tablet Take 2 tablets (1,000 mg total) by mouth daily with breakfast. (Patient taking differently: Take 500 mg by mouth daily with breakfast.) 180 tablet 2   insulin glargine-yfgn (SEMGLEE, YFGN,) 100 UNIT/ML Pen Inject 28 Units into the skin daily.     metFORMIN (GLUCOPHAGE-XR) 500 MG 24 hr tablet Take 1 tablet (500 mg total) by  mouth daily with breakfast.     No facility-administered medications prior to visit.     Per HPI unless specifically indicated in ROS section below Review of Systems  Constitutional:  Negative for activity change, appetite change, chills, fatigue, fever and unexpected weight change.  HENT:  Negative for hearing loss.   Eyes:  Negative for visual disturbance.  Respiratory:  Negative for cough, chest tightness, shortness of breath and wheezing.   Cardiovascular:  Negative for chest pain, palpitations and leg swelling.  Gastrointestinal:  Negative for abdominal distention, abdominal pain, blood in stool, constipation, diarrhea, nausea and vomiting.   Genitourinary:  Negative for difficulty urinating and hematuria.  Musculoskeletal:  Negative for arthralgias, myalgias and neck pain.  Skin:  Negative for rash.  Neurological:  Positive for headaches. Negative for dizziness, seizures and syncope.  Hematological:  Negative for adenopathy. Does not bruise/bleed easily.  Psychiatric/Behavioral:  Negative for dysphoric mood. The patient is not nervous/anxious.     Objective:  BP 120/74 (BP Location: Right Arm, Patient Position: Sitting)   Pulse 68   Temp 97.9 F (36.6 C) (Skin)   Ht 5' 9.5" (1.765 m)   Wt 212 lb (96.2 kg)   SpO2 98%   BMI 30.86 kg/m   Wt Readings from Last 3 Encounters:  05/04/22 212 lb (96.2 kg)  12/30/21 205 lb (93 kg)  11/28/21 192 lb 4 oz (87.2 kg)      Physical Exam Vitals and nursing note reviewed.  Constitutional:      General: He is not in acute distress.    Appearance: Normal appearance. He is well-developed. He is not ill-appearing.  HENT:     Head: Normocephalic and atraumatic.     Right Ear: Hearing, tympanic membrane, ear canal and external ear normal.     Left Ear: Hearing, tympanic membrane, ear canal and external ear normal.     Mouth/Throat:     Mouth: Mucous membranes are moist.     Pharynx: Oropharynx is clear. No oropharyngeal exudate or posterior oropharyngeal erythema.  Eyes:     General: No scleral icterus.    Extraocular Movements: Extraocular movements intact.     Conjunctiva/sclera: Conjunctivae normal.     Pupils: Pupils are equal, round, and reactive to light.  Neck:     Thyroid: No thyroid mass or thyromegaly.  Cardiovascular:     Rate and Rhythm: Normal rate and regular rhythm.     Pulses: Normal pulses.          Radial pulses are 2+ on the right side and 2+ on the left side.     Heart sounds: Normal heart sounds. No murmur heard. Pulmonary:     Effort: Pulmonary effort is normal. No respiratory distress.     Breath sounds: Normal breath sounds. No wheezing, rhonchi or  rales.  Abdominal:     General: Bowel sounds are normal. There is no distension.     Palpations: Abdomen is soft. There is no mass.     Tenderness: There is no abdominal tenderness. There is no guarding or rebound.     Hernia: No hernia is present.  Musculoskeletal:        General: Normal range of motion.     Cervical back: Normal range of motion and neck supple.     Right lower leg: No edema.     Left lower leg: No edema.  Lymphadenopathy:     Cervical: No cervical adenopathy.  Skin:    General: Skin is warm and  dry.     Findings: No rash.  Neurological:     General: No focal deficit present.     Mental Status: He is alert and oriented to person, place, and time.  Psychiatric:        Mood and Affect: Mood normal.        Behavior: Behavior normal.        Thought Content: Thought content normal.        Judgment: Judgment normal.       Results for orders placed or performed in visit on 04/23/22  TSH  Result Value Ref Range   TSH 1.47 0.35 - 5.50 uIU/mL  Comprehensive metabolic panel  Result Value Ref Range   Sodium 138 135 - 145 mEq/L   Potassium 4.1 3.5 - 5.1 mEq/L   Chloride 99 96 - 112 mEq/L   CO2 33 (H) 19 - 32 mEq/L   Glucose, Bld 104 (H) 70 - 99 mg/dL   BUN 13 6 - 23 mg/dL   Creatinine, Ser 1.15 0.40 - 1.50 mg/dL   Total Bilirubin 0.7 0.2 - 1.2 mg/dL   Alkaline Phosphatase 62 39 - 117 U/L   AST 17 0 - 37 U/L   ALT 18 0 - 53 U/L   Total Protein 7.5 6.0 - 8.3 g/dL   Albumin 4.7 3.5 - 5.2 g/dL   GFR 77.91 >60.00 mL/min   Calcium 9.6 8.4 - 10.5 mg/dL  Lipid panel  Result Value Ref Range   Cholesterol 189 0 - 200 mg/dL   Triglycerides 84.0 0.0 - 149.0 mg/dL   HDL 53.20 >39.00 mg/dL   VLDL 16.8 0.0 - 40.0 mg/dL   LDL Cholesterol 119 (H) 0 - 99 mg/dL   Total CHOL/HDL Ratio 4    NonHDL 136.08   Microalbumin / creatinine urine ratio  Result Value Ref Range   Microalb, Ur 2.9 (H) 0.0 - 1.9 mg/dL   Creatinine,U 329.1 mg/dL   Microalb Creat Ratio 0.9 0.0 - 30.0  mg/g  Hemoglobin A1c  Result Value Ref Range   Hgb A1c MFr Bld 7.1 (H) 4.6 - 6.5 %   Assessment & Plan:   Problem List Items Addressed This Visit     Healthcare maintenance - Primary (Chronic)    Preventative protocols reviewed and updated unless pt declined. Discussed healthy diet and lifestyle.       Dyslipidemia associated with type 2 diabetes mellitus (HCC)    Chronic, LDL improved however above goal. Discussed statin indication. He will focus on healthy diet changes. Discussed possible bile acid sequestrant.  The 10-year ASCVD risk score (Arnett DK, et al., 2019) is: 2.3%   Values used to calculate the score:     Age: 34 years     Sex: Male     Is Non-Hispanic African American: No     Diabetic: Yes     Tobacco smoker: No     Systolic Blood Pressure: 123456 mmHg     Is BP treated: No     HDL Cholesterol: 53.2 mg/dL     Total Cholesterol: 189 mg/dL       Relevant Medications   metFORMIN (GLUCOPHAGE-XR) 500 MG 24 hr tablet   insulin glargine-yfgn (SEMGLEE, YFGN,) 100 UNIT/ML Pen   Obesity, Class I, BMI 30-34.9    Discussed weight gain noted, encouraged healthy diet and lifestyle choices to affect sustainable weight loss.       OSA on CPAP    Appreciate [facility] pulm care.  Primary hypertension    Chronic, stable off medication.       Type 2 diabetes mellitus with hyperglycemia, with long-term current use of insulin    Chronic, continued improvement noted with latest A1c down to 7.1%.  Continue current regimen.  He's had difficulty tolerating higher metformin XR dose - will stay at 500mg  daily dose.  Continue Semglee 25-28u daily.       Relevant Medications   metFORMIN (GLUCOPHAGE-XR) 500 MG 24 hr tablet   insulin glargine-yfgn (SEMGLEE, YFGN,) 100 UNIT/ML Pen   Postcholecystectomy diarrhea    Daily probiotic helps  To consider bile acid sequestrant.         Meds ordered this encounter  Medications   Continuous Blood Gluc Sensor (FREESTYLE LIBRE 3 SENSOR)  MISC    Sig: Place 1 sensor on the skin every 14 days. Use to check glucose continuously    Dispense:  6 each    Refill:  3    No orders of the defined types were placed in this encounter.   Patient Instructions  Good to see you today. 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  You are doing well today  Return as needed or in 6 months for diabetes follow up visit.   Follow up plan: Return in about 6 months (around 11/03/2022) for follow up visit.  Ria Bush, MD

## 2022-05-04 NOTE — Assessment & Plan Note (Signed)
Discussed weight gain noted, encouraged healthy diet and lifestyle choices to affect sustainable weight loss.  

## 2022-05-04 NOTE — Assessment & Plan Note (Signed)
Preventative protocols reviewed and updated unless pt declined. Discussed healthy diet and lifestyle.  

## 2022-05-04 NOTE — Assessment & Plan Note (Signed)
Appreciate [facility] pul[facility] care.

## 2022-09-15 ENCOUNTER — Other Ambulatory Visit: Payer: Self-pay | Admitting: Family Medicine

## 2022-09-16 NOTE — Telephone Encounter (Signed)
Semglee Last filled:  09/09/22 Last OV:  05/04/22, CPE Next OV:  none

## 2022-09-17 NOTE — Telephone Encounter (Signed)
ERx 

## 2022-10-20 ENCOUNTER — Emergency Department (HOSPITAL_COMMUNITY)
Admission: EM | Admit: 2022-10-20 | Discharge: 2022-10-21 | Discharge status: Against Medical Advice | Payer: PRIVATE HEALTH INSURANCE | Attending: Emergency Medicine | Admitting: Emergency Medicine

## 2022-10-20 ENCOUNTER — Emergency Department (HOSPITAL_COMMUNITY): Payer: PRIVATE HEALTH INSURANCE

## 2022-10-20 ENCOUNTER — Encounter (HOSPITAL_COMMUNITY): Payer: Self-pay | Admitting: Emergency Medicine

## 2022-10-20 DIAGNOSIS — Y844 Aspiration of fluid as the cause of abnormal reaction of the patient, or of later complication, without mention of misadventure at the time of the procedure: Secondary | ICD-10-CM | POA: Diagnosis not present

## 2022-10-20 DIAGNOSIS — Z5321 Procedure and treatment not carried out due to patient leaving prior to being seen by health care provider: Secondary | ICD-10-CM | POA: Insufficient documentation

## 2022-10-20 DIAGNOSIS — T17920A Food in respiratory tract, part unspecified causing asphyxiation, initial encounter: Secondary | ICD-10-CM | POA: Insufficient documentation

## 2022-10-20 NOTE — ED Triage Notes (Signed)
Pt here from [facility] thinking that he may have accidentally aspirated a pill he takes as a supplement

## 2022-10-21 ENCOUNTER — Encounter: Payer: Self-pay | Admitting: Family Medicine

## 2022-10-21 NOTE — ED Notes (Signed)
Pt stated he no longer wanted to wait and was seen leaving the ED

## 2022-10-22 NOTE — Telephone Encounter (Signed)
Noted. Appreciate Dr Ermalene Searing seeing this nice physical therapist

## 2022-10-22 NOTE — Telephone Encounter (Signed)
Please triage pt

## 2022-10-22 NOTE — Telephone Encounter (Signed)
I spoke with pt; pt said last weekend took large capsule supplement and pt is not sure if aspirated or not; pt said now still feels like can feel something stuck in rt lung when coughs or swallows. Pt has dry cough; no fever and no CP or SOB. pt left [facility] on 10/20/22 without being seen but did have CXR. Pt went to UC and they advised pt to go to ED. Pt does not want to go to ED and scheduled appt with Dr Ermalene Searing on 10/23/22 at 12 noon with UC &ED precautions and pt voiced understanding. Sending note to Dr Ermalene Searing and Lorain Childes to Dr Reece Agar who is out of office today.

## 2022-10-23 ENCOUNTER — Encounter: Payer: Self-pay | Admitting: Family Medicine

## 2022-10-23 ENCOUNTER — Ambulatory Visit (INDEPENDENT_AMBULATORY_CARE_PROVIDER_SITE_OTHER): Payer: No Typology Code available for payment source | Admitting: Family Medicine

## 2022-10-23 VITALS — BP 120/74 | HR 87 | Temp 98.7°F | Ht 69.5 in | Wt 212.4 lb

## 2022-10-23 DIAGNOSIS — T17908A Unspecified foreign body in respiratory tract, part unspecified causing other injury, initial encounter: Secondary | ICD-10-CM | POA: Insufficient documentation

## 2022-10-23 DIAGNOSIS — R079 Chest pain, unspecified: Secondary | ICD-10-CM | POA: Insufficient documentation

## 2022-10-23 DIAGNOSIS — T17908D Unspecified foreign body in respiratory tract, part unspecified causing other injury, subsequent encounter: Secondary | ICD-10-CM

## 2022-10-23 NOTE — Assessment & Plan Note (Signed)
Acute, reviewed chest x-ray in detail with patient.  Area of concern turned out to be his continuous glucose monitor on his arm laterally. Otherwise chest x-ray is clear.  No sign of pneumonia or clear aspiration changes.  Patient's lung exam is normal.  He is asymptomatic except for the chest tenderness.  I feel most likely the chest tenderness is likely referred pain from esophageal irritation, reflux etc. If he did aspirate acid or a amino acid supplement pill symptoms from this should resolve over time.  We did review return precautions.  If he has new fever or any shortness of breath we can consider evaluating further possibly with a CT scan.

## 2022-10-23 NOTE — Progress Notes (Signed)
Patient ID: Alan Barber, male    DOB: [birth year], 42 y.o.   MRN: [id number]  This visit was conducted in person.  BP 120/74 (BP Location: Right Arm, Patient Position: Sitting, Cuff Size: Large)   Pulse 87   Temp 98.7 F (37.1 C) (Temporal)   Ht 5' 9.5" (1.765 m)   Wt 212 lb 6 oz (96.3 kg)   SpO2 98%   BMI 30.91 kg/m    CC:  Chief Complaint  Patient presents with   Hospitalization Follow-up    ER follow up-Feels like he aspirated a pill and it is stuck in his chest.  Went to ER and got an x-ray.  He waited for 7 hours then left w/o being seen.     Subjective:   HPI: Alan Barber is a 42 y.o. male presenting on 10/23/2022 for Hospitalization Follow-up (ER follow up-Feels like he aspirated a pill and it is stuck in his chest.  Went to ER and got an x-ray.  He waited for 7 hours then left w/o being seen. )   He started taking new supplement 3 weeks ago, had had trouble swallowing them.. no known aspiration event.  Woke up in AM 9/16 with feeling something stuck in right upper chest .  Feel pressure when swallowing and coughing.  Has been feeling sick 2-3 days prior to 9/16.Marland Kitchen post nasal drip and nasl congestion.  Occ cough.  No shortness of breath.  No fever.   Went to ER left before being seen. He feels like before he left on his lateral CXR  something that looked like a pill and would like to review this in detail with me today.       Relevant past medical, surgical, family and social history reviewed and updated as indicated. Interim medical history since our last visit reviewed. Allergies and medications reviewed and updated. Outpatient Medications Prior to Visit  Medication Sig Dispense Refill   B Complex Vitamins (VITAMIN B COMPLEX PO) Take by mouth.     Blood Glucose Monitoring Suppl (ONE TOUCH ULTRA 2) w/Device KIT Use as instructed to check blood sugar once a day 1 kit 0   Cholecalciferol (VITAMIN D3) 25 MCG (1000 UT) CAPS Take 1 capsule (1,000 Units total)  by mouth daily. During winter months 30 capsule    Continuous Blood Gluc Sensor (FREESTYLE LIBRE 3 SENSOR) MISC Place 1 sensor on the skin every 14 days. Use to check glucose continuously 6 each 3   fluticasone (FLONASE) 50 MCG/ACT nasal spray Place 2 sprays into both nostrils daily as needed for allergies or rhinitis.     glucose blood (ONETOUCH ULTRA) test strip Use as instructed to check blood sugar once a day 100 each 0   insulin glargine-yfgn (SEMGLEE, YFGN,) 100 UNIT/ML Pen INJECT 34 UNITS UNDER THE SKIN DAILY 45 mL 3   Insulin Pen Needle (BD PEN NEEDLE NANO U/F) 32G X 4 MM MISC Use to inject insulin daily 100 each 3   Lancets (ONETOUCH DELICA PLUS LANCET33G) MISC Use as instructed to check blood sugar once a day 100 each 0   loratadine (CLARITIN) 10 MG tablet Take 10 mg by mouth daily.     Probiotic Product (PROBIOTIC DAILY PO) Take by mouth daily.     metFORMIN (GLUCOPHAGE-XR) 500 MG 24 hr tablet Take 1 tablet (500 mg total) by mouth daily with breakfast.     No facility-administered medications prior to visit.     Per HPI unless specifically indicated in ROS  section below Review of Systems  Constitutional:  Negative for fatigue and fever.  HENT:  Negative for ear pain.   Eyes:  Negative for pain.  Respiratory:  Negative for cough and shortness of breath.   Cardiovascular:  Negative for chest pain, palpitations and leg swelling.  Gastrointestinal:  Negative for abdominal pain.  Genitourinary:  Negative for dysuria.  Musculoskeletal:  Negative for arthralgias.  Neurological:  Negative for syncope, light-headedness and headaches.  Psychiatric/Behavioral:  Negative for dysphoric mood.    Objective:  BP 120/74 (BP Location: Right Arm, Patient Position: Sitting, Cuff Size: Large)   Pulse 87   Temp 98.7 F (37.1 C) (Temporal)   Ht 5' 9.5" (1.765 m)   Wt 212 lb 6 oz (96.3 kg)   SpO2 98%   BMI 30.91 kg/m   Wt Readings from Last 3 Encounters:  10/23/22 212 lb 6 oz (96.3 kg)   05/04/22 212 lb (96.2 kg)  12/30/21 205 lb (93 kg)      Physical Exam Constitutional:      Appearance: He is well-developed.  HENT:     Head: Normocephalic.     Right Ear: Hearing normal.     Left Ear: Hearing normal.     Nose: Nose normal.  Neck:     Thyroid: No thyroid mass or thyromegaly.     Vascular: No carotid bruit.     Trachea: Trachea normal.  Cardiovascular:     Rate and Rhythm: Normal rate and regular rhythm.     Pulses: Normal pulses.     Heart sounds: Heart sounds not distant. No murmur heard.    No friction rub. No gallop.     Comments: No peripheral edema Pulmonary:     Effort: Pulmonary effort is normal. No respiratory distress.     Breath sounds: Normal breath sounds.  Skin:    General: Skin is warm and dry.     Findings: No rash.  Psychiatric:        Speech: Speech normal.        Behavior: Behavior normal.        Thought Content: Thought content normal.       Results for orders placed or performed in visit on 04/23/22  TSH  Result Value Ref Range   TSH 1.47 0.35 - 5.50 uIU/mL  Comprehensive metabolic panel  Result Value Ref Range   Sodium 138 135 - 145 mEq/L   Potassium 4.1 3.5 - 5.1 mEq/L   Chloride 99 96 - 112 mEq/L   CO2 33 (H) 19 - 32 mEq/L   Glucose, Bld 104 (H) 70 - 99 mg/dL   BUN 13 6 - 23 mg/dL   Creatinine, Ser 4.69 0.40 - 1.50 mg/dL   Total Bilirubin 0.7 0.2 - 1.2 mg/dL   Alkaline Phosphatase 62 39 - 117 U/L   AST 17 0 - 37 U/L   ALT 18 0 - 53 U/L   Total Protein 7.5 6.0 - 8.3 g/dL   Albumin 4.7 3.5 - 5.2 g/dL   GFR 62.95 >28.41 mL/min   Calcium 9.6 8.4 - 10.5 mg/dL  Lipid panel  Result Value Ref Range   Cholesterol 189 0 - 200 mg/dL   Triglycerides 32.4 0.0 - 149.0 mg/dL   HDL 40.10 >27.25 mg/dL   VLDL 36.6 0.0 - 44.0 mg/dL   LDL Cholesterol 347 (H) 0 - 99 mg/dL   Total CHOL/HDL Ratio 4    NonHDL 136.08   Microalbumin / creatinine urine ratio  Result Value  Ref Range   Microalb, Ur 2.9 (H) 0.0 - 1.9 mg/dL   Creatinine,U  409.8 mg/dL   Microalb Creat Ratio 0.9 0.0 - 30.0 mg/g  Hemoglobin A1c  Result Value Ref Range   Hgb A1c MFr Bld 7.1 (H) 4.6 - 6.5 %    Assessment and Plan  There are no diagnoses linked to this encounter.  No follow-ups on file.   Kerby Nora, MD

## 2022-10-23 NOTE — Assessment & Plan Note (Signed)
Possible aspiration into the airway of stomach acid given reflux or possibly a pill although he does not recall a specific event.

## 2022-12-04 ENCOUNTER — Encounter: Payer: Self-pay | Admitting: Family Medicine

## 2022-12-10 MED ORDER — FREESTYLE LIBRE 3 PLUS SENSOR MISC: 3 refills | Status: DC

## 2023-03-03 ENCOUNTER — Encounter: Payer: Self-pay | Admitting: Family Medicine

## 2023-03-18 MED ORDER — INSULIN GLARGINE-YFGN 100 UNIT/ML ~~LOC~~ SOLN: 34.0000 [IU] | Freq: Every day | SUBCUTANEOUS | 3 refills | Status: DC

## 2023-03-18 MED ORDER — INSULIN SYRINGE 29G X 1/2" 0.5 ML MISC: 3 refills | Status: DC

## 2023-03-18 MED ORDER — INSULIN SYRINGE-NEEDLE U-100 30G X 1/2" 0.5 ML MISC: 3 refills | Status: AC

## 2023-03-18 NOTE — Telephone Encounter (Signed)
Message sent to patient to let know you are out of office today and will respond once you are back.

## 2023-03-18 NOTE — Addendum Note (Signed)
Addended by: Eustaquio Boyden on: 03/18/2023 11:19 AM   Modules accepted: Orders

## 2023-03-22 ENCOUNTER — Ambulatory Visit (INDEPENDENT_AMBULATORY_CARE_PROVIDER_SITE_OTHER): Payer: No Typology Code available for payment source | Admitting: Family Medicine

## 2023-03-22 ENCOUNTER — Encounter: Payer: Self-pay | Admitting: Family Medicine

## 2023-03-22 VITALS — BP 150/92 | HR 88 | Temp 98.5°F | Ht 69.5 in | Wt 206.4 lb

## 2023-03-22 DIAGNOSIS — E1169 Type 2 diabetes mellitus with other specified complication: Secondary | ICD-10-CM | POA: Diagnosis not present

## 2023-03-22 DIAGNOSIS — I10 Essential (primary) hypertension: Secondary | ICD-10-CM

## 2023-03-22 DIAGNOSIS — E1165 Type 2 diabetes mellitus with hyperglycemia: Secondary | ICD-10-CM

## 2023-03-22 DIAGNOSIS — E785 Hyperlipidemia, unspecified: Secondary | ICD-10-CM

## 2023-03-22 DIAGNOSIS — Z794 Long term (current) use of insulin: Secondary | ICD-10-CM

## 2023-03-22 LAB — POCT GLYCOSYLATED HEMOGLOBIN (HGB A1C): Hemoglobin A1C: 10.8 % — AB (ref 4.0–5.6)

## 2023-03-22 MED ORDER — SEMAGLUTIDE(0.25 OR 0.5MG/DOS) 2 MG/3ML ~~LOC~~ SOPN: 0.5000 mg | PEN_INJECTOR | SUBCUTANEOUS | 1 refills | Status: DC

## 2023-03-22 MED ORDER — OZEMPIC (0.25 OR 0.5 MG/DOSE) 2 MG/3ML ~~LOC~~ SOPN: 0.2500 mg | PEN_INJECTOR | SUBCUTANEOUS | 0 refills | Status: DC

## 2023-03-22 NOTE — Assessment & Plan Note (Signed)
 Elevated BP reading again noted.  BP log sheet provided, I asked him to start monitoring regularly at home - at least a few times a week - and to let us know if consistently >140/90 prior to next visit.

## 2023-03-22 NOTE — Assessment & Plan Note (Signed)
 Will need to review statin commencement.

## 2023-03-22 NOTE — Patient Instructions (Addendum)
 Schedule diabetic eye exam.  Restart Semglee 30u and titrate up as needed (2 units every 3 days if average fasting sugar >150).  Start ozempic 0.25mg  weekly for 1 month then increase to 0.5mg  weekly.  BP was also elevated- start monitoring at home. Increase water, fruit/veggie intake, limit salt/sodium.  Let me know if consistently >140/90 otherwise we will recheck next visit.  Return in 3 months for physical

## 2023-03-22 NOTE — Progress Notes (Signed)
 Ph: [phone] Fax: [phone]   Patient ID: Alan Barber, male    DOB: Jun 27, 1980, 43 y.o.   MRN: [id number]  This visit was conducted in person.  BP (!) 150/92 (BP Location: Right Arm, Cuff Size: Large)   Pulse 88   Temp 98.5 F (36.9 C) (Oral)   Ht 5' 9.5" (1.765 m)   Wt 206 lb 6 oz (93.6 kg)   SpO2 98%   BMI 30.04 kg/m   BP Readings from Last 3 Encounters:  03/22/23 (!) 150/92  10/23/22 120/74  10/20/22 (!) 155/107   CC: DM f/u  Subjective:   HPI: Alan Barber is a 43 y.o. male presenting on 03/22/2023 for Medical Management of Chronic Issues (Here for DM f/u.Wants to discuss starting Ozempic. 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   BP elevated today. Normally at home 130/80s, not recently checked.   DM - does regularly check sugars. Current antihyperglycemic regimen which includes: Semglee 34 u daily - but has been out for the past month. Denies low sugars or hypoglycemic symptoms. Denies paresthesias, blurry vision. Last diabetic eye exam DUE. Last foot exam: DUE. DSME: declines. CGM data: freestyle libre 3+ 30 day average sugar: 315, time in range 5%, hypoglycemia 0%, stage 1 hyperglycemia 19%, stage 2 hyperglycemia 76%.   Lab Results  Component Value Date   HGBA1C 10.8 (A) 03/22/2023   Diabetic Foot Exam - Simple   Simple Foot Form Diabetic Foot exam was performed with the following findings: Yes 03/22/2023  3:42 PM  Visual Inspection No deformities, no ulcerations, no other skin breakdown bilaterally: Yes Sensation Testing Intact to touch and monofilament testing bilaterally: Yes Pulse Check Posterior Tibialis and Dorsalis pulse intact bilaterally: Yes Comments No claudication    Lab Results  Component Value Date   MICROALBUR 2.9 (H) 04/23/2022         Relevant past medical, surgical, family and social history reviewed and updated as indicated. Interim medical history since our last visit  reviewed. Allergies and medications reviewed and updated. Outpatient Medications Prior to Visit  Medication Sig Dispense Refill   Blood Glucose Monitoring Suppl (ONE TOUCH ULTRA 2) w/Device KIT Use as instructed to check blood sugar once a day 1 kit 0   Continuous Glucose Sensor (FREESTYLE LIBRE 3 PLUS SENSOR) MISC Change sensor every 15 days. 6 each 3   fluticasone (FLONASE) 50 MCG/ACT nasal spray Place 2 sprays into both nostrils daily as needed for allergies or rhinitis.     glucose blood (ONETOUCH ULTRA) test strip Use as instructed to check blood sugar once a day 100 each 0   insulin glargine-yfgn (SEMGLEE) 100 UNIT/ML injection Inject 0.34 mLs (34 Units total) into the skin daily. 40 mL 3   insulin glargine-yfgn (SEMGLEE, YFGN,) 100 UNIT/ML Pen INJECT 34 UNITS UNDER THE SKIN DAILY 45 mL 3   Insulin Pen Needle (BD PEN NEEDLE NANO U/F) 32G X 4 MM MISC Use to inject insulin daily 100 each 3   Insulin Syringe-Needle U-100 30G X 1/2" 0.5 ML MISC Use as directed to inject insulin daily 100 each 3   Lancets (ONETOUCH DELICA PLUS LANCET33G) MISC Use as instructed to check blood sugar once a day 100 each 0   loratadine (CLARITIN) 10 MG tablet Take 10 mg by mouth daily.     Probiotic Product (PROBIOTIC DAILY PO) Take by mouth daily.     B Complex Vitamins (VITAMIN B COMPLEX PO) Take by mouth.  Cholecalciferol (VITAMIN D3) 25 MCG (1000 UT) CAPS Take 1 capsule (1,000 Units total) by mouth daily. During winter months 30 capsule    No facility-administered medications prior to visit.     Per HPI unless specifically indicated in ROS section below Review of Systems  Objective:  BP (!) 150/92 (BP Location: Right Arm, Cuff Size: Large)   Pulse 88   Temp 98.5 F (36.9 C) (Oral)   Ht 5' 9.5" (1.765 m)   Wt 206 lb 6 oz (93.6 kg)   SpO2 98%   BMI 30.04 kg/m   Wt Readings from Last 3 Encounters:  03/22/23 206 lb 6 oz (93.6 kg)  10/23/22 212 lb 6 oz (96.3 kg)  05/04/22 212 lb (96.2 kg)       Physical Exam Vitals and nursing note reviewed.  Constitutional:      Appearance: Normal appearance. He is not ill-appearing.  Eyes:     Extraocular Movements: Extraocular movements intact.     Conjunctiva/sclera: Conjunctivae normal.     Pupils: Pupils are equal, round, and reactive to light.  Cardiovascular:     Rate and Rhythm: Normal rate and regular rhythm.     Pulses: Normal pulses.     Heart sounds: Normal heart sounds. No murmur heard. Pulmonary:     Effort: Pulmonary effort is normal. No respiratory distress.     Breath sounds: Normal breath sounds. No wheezing, rhonchi or rales.  Musculoskeletal:     Right lower leg: No edema.     Left lower leg: No edema.     Comments: See HPI for foot exam if done  Skin:    General: Skin is warm and dry.     Findings: No rash.  Neurological:     Mental Status: He is alert.  Psychiatric:        Mood and Affect: Mood normal.        Behavior: Behavior normal.       Results for orders placed or performed in visit on 03/22/23  POCT glycosylated hemoglobin (Hb A1C)   Collection Time: 03/22/23  3:08 PM  Result Value Ref Range   Hemoglobin A1C 10.8 (A) 4.0 - 5.6 %   HbA1c POC (<> result, manual entry)     HbA1c, POC (prediabetic range)     HbA1c, POC (controlled diabetic range)      Assessment & Plan:   Problem List Items Addressed This Visit     Dyslipidemia associated with type 2 diabetes mellitus (HCC)   Will need to review statin commencement.       Relevant Medications   Semaglutide,0.25 or 0.5MG /DOS, (OZEMPIC, 0.25 OR 0.5 MG/DOSE,) 2 MG/3ML SOPN   Semaglutide,0.25 or 0.5MG /DOS, 2 MG/3ML SOPN (Start on 04/19/2023)   Primary hypertension   Elevated BP reading again noted.  BP log sheet provided, I asked him to start monitoring regularly at home - at least a few times a week - and to let us know if consistently >140/90 prior to next visit.      Type 2 diabetes mellitus with hyperglycemia, with long-term current use of  insulin (HCC) - Primary   Chronic, deteriorated in setting of running out of insulin last month then [facility] cost rising significantly. He is about to restart Semglee vials which are somewhat more affordable. Discussed titrating schedule - start at 30u daily and increase by 2u every 3 days if fasting average >150. He is also interested in [place]. Reviewed mechanism of action of medication as well as side  effects and adverse events to watch for including nausea, diarrhea, constipation, pancreatitis. Will need to verify no fmhx medullary thyroid cancer or MEN2. Discussed titration schedule for medication. Will start ozempic 0.25mg  weekly x 1 month then increase to 0.5mg  weekly.  Also discussed option of SGLT2i's.  RTC 3 mo CPE/DM f/u Recommend he schedule diabetic eye exam.  He declines DSME referral      Relevant Medications   Semaglutide,0.25 or 0.5MG /DOS, (OZEMPIC, 0.25 OR 0.5 MG/DOSE,) 2 MG/3ML SOPN   Semaglutide,0.25 or 0.5MG /DOS, 2 MG/3ML SOPN (Start on 04/19/2023)   Other Relevant Orders   POCT glycosylated hemoglobin (Hb A1C) (Completed)     Meds ordered this encounter  Medications   Semaglutide,0.25 or 0.5MG /DOS, (OZEMPIC, 0.25 OR 0.5 MG/DOSE,) 2 MG/3ML SOPN    Sig: Inject 0.25 mg into the skin once a week.    Dispense:  3 mL    Refill:  0   Semaglutide,0.25 or 0.5MG /DOS, 2 MG/3ML SOPN    Sig: Inject 0.5 mg into the skin once a week.    Dispense:  9 mL    Refill:  1    Orders Placed This Encounter  Procedures   POCT glycosylated hemoglobin (Hb A1C)    Patient Instructions  Schedule diabetic eye exam.  Restart Semglee 30u and titrate up as needed (2 units every 3 days if average fasting sugar >150).  Start ozempic 0.25mg  weekly for 1 month then increase to 0.5mg  weekly.  BP was also elevated- start monitoring at home. Increase water, fruit/veggie intake, limit salt/sodium.  Let me know if consistently >140/90 otherwise we will recheck next visit.  Return in 3 months for  physical  Follow up plan: Return in about 3 months (around 06/19/2023) for annual exam, prior fasting for blood work.  Eustaquio Boyden, MD

## 2023-03-22 NOTE — Assessment & Plan Note (Addendum)
 Chronic, deteriorated in setting of running out of insulin last month then [facility] cost rising significantly. He is about to restart Semglee vials which are somewhat more affordable. Discussed titrating schedule - start at 30u daily and increase by 2u every 3 days if fasting average >150. He is also interested in [place]. Reviewed mechanism of action of medication as well as side effects and adverse events to watch for including nausea, diarrhea, constipation, pancreatitis. Will need to verify no fmhx medullary thyroid cancer or MEN2. Discussed titration schedule for medication. Will start ozempic 0.25mg  weekly x 1 month then increase to 0.5mg  weekly.  Also discussed option of SGLT2i's.  RTC 3 mo CPE/DM f/u Recommend he schedule diabetic eye exam.  He declines DSME referral

## 2023-03-25 ENCOUNTER — Telehealth: Payer: Self-pay

## 2023-03-25 IMAGING — DX DG CHEST 2V
2 series · 2 of 2 positions shown · non-contrast
Comparison: None.

CLINICAL DATA: Chest pain

EXAM:
CHEST - 2 VIEW

[w chest pa]
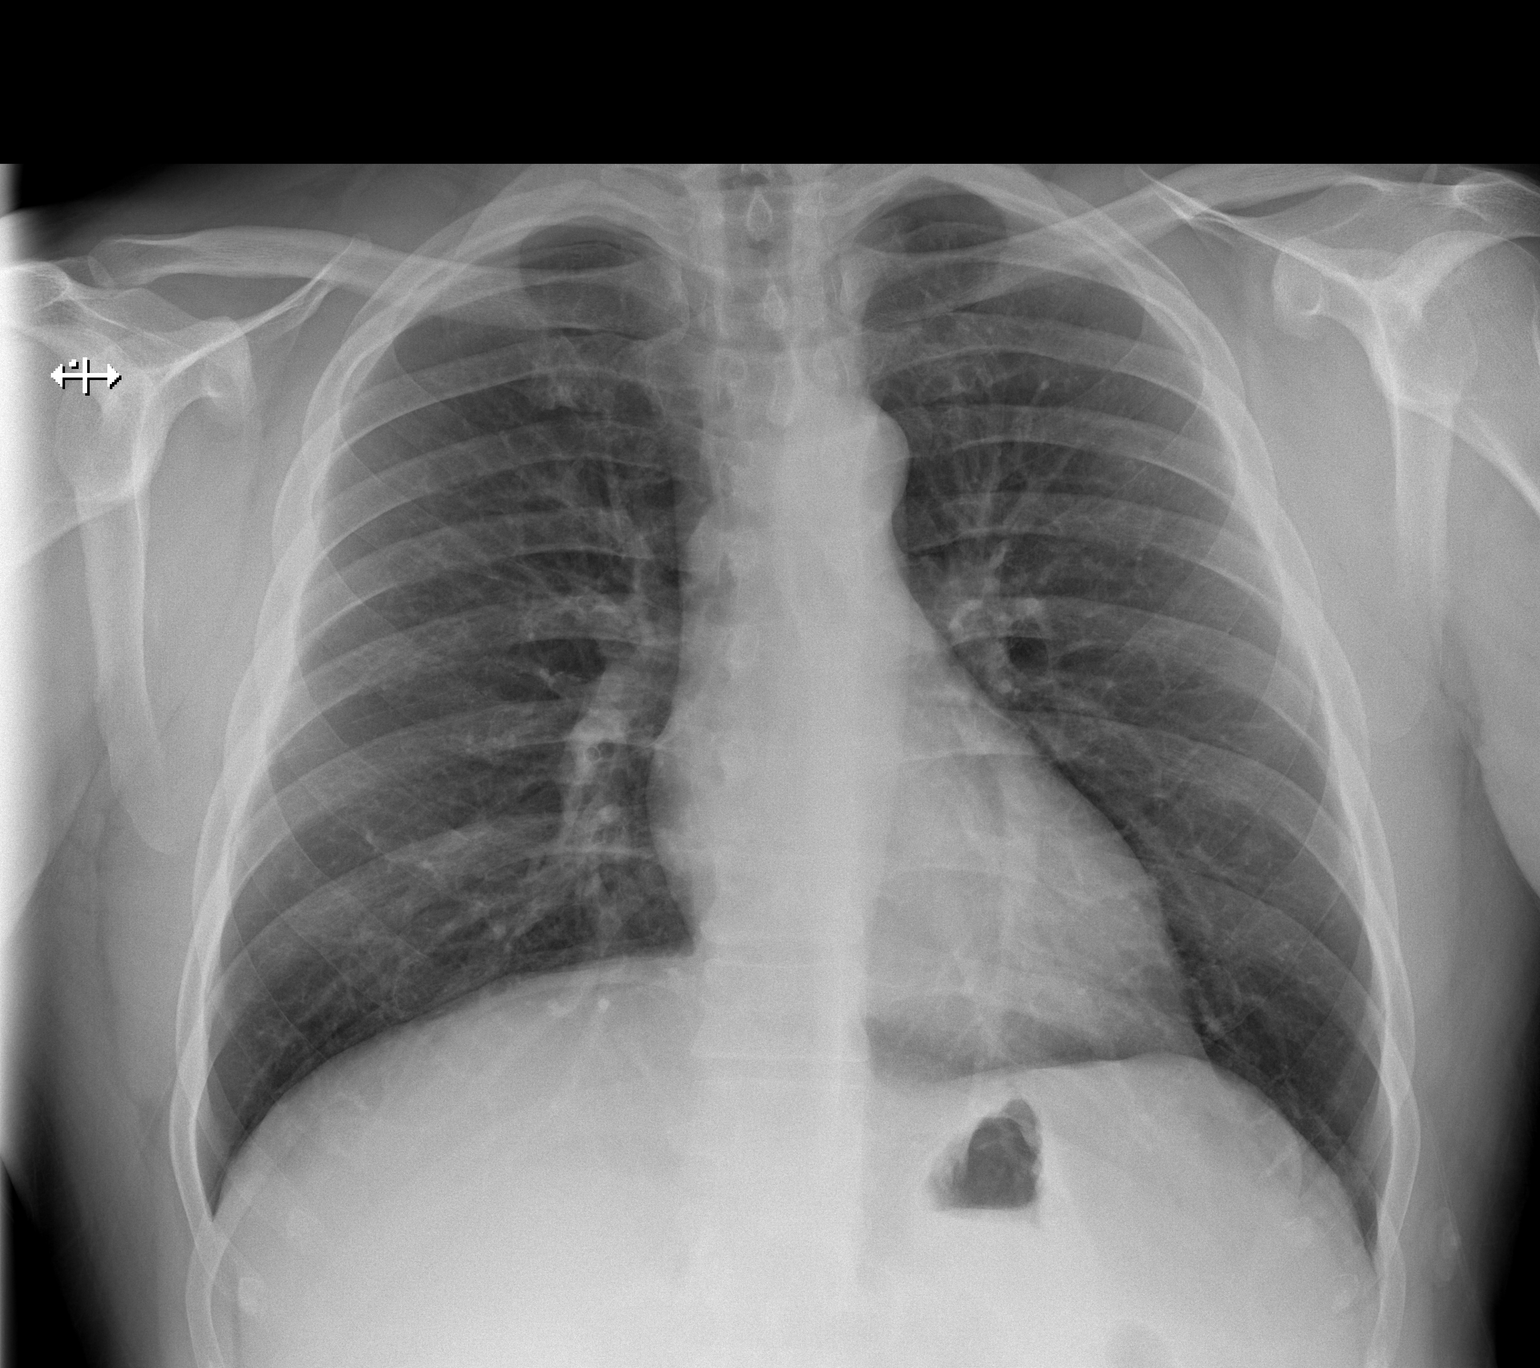

[w chest lat]
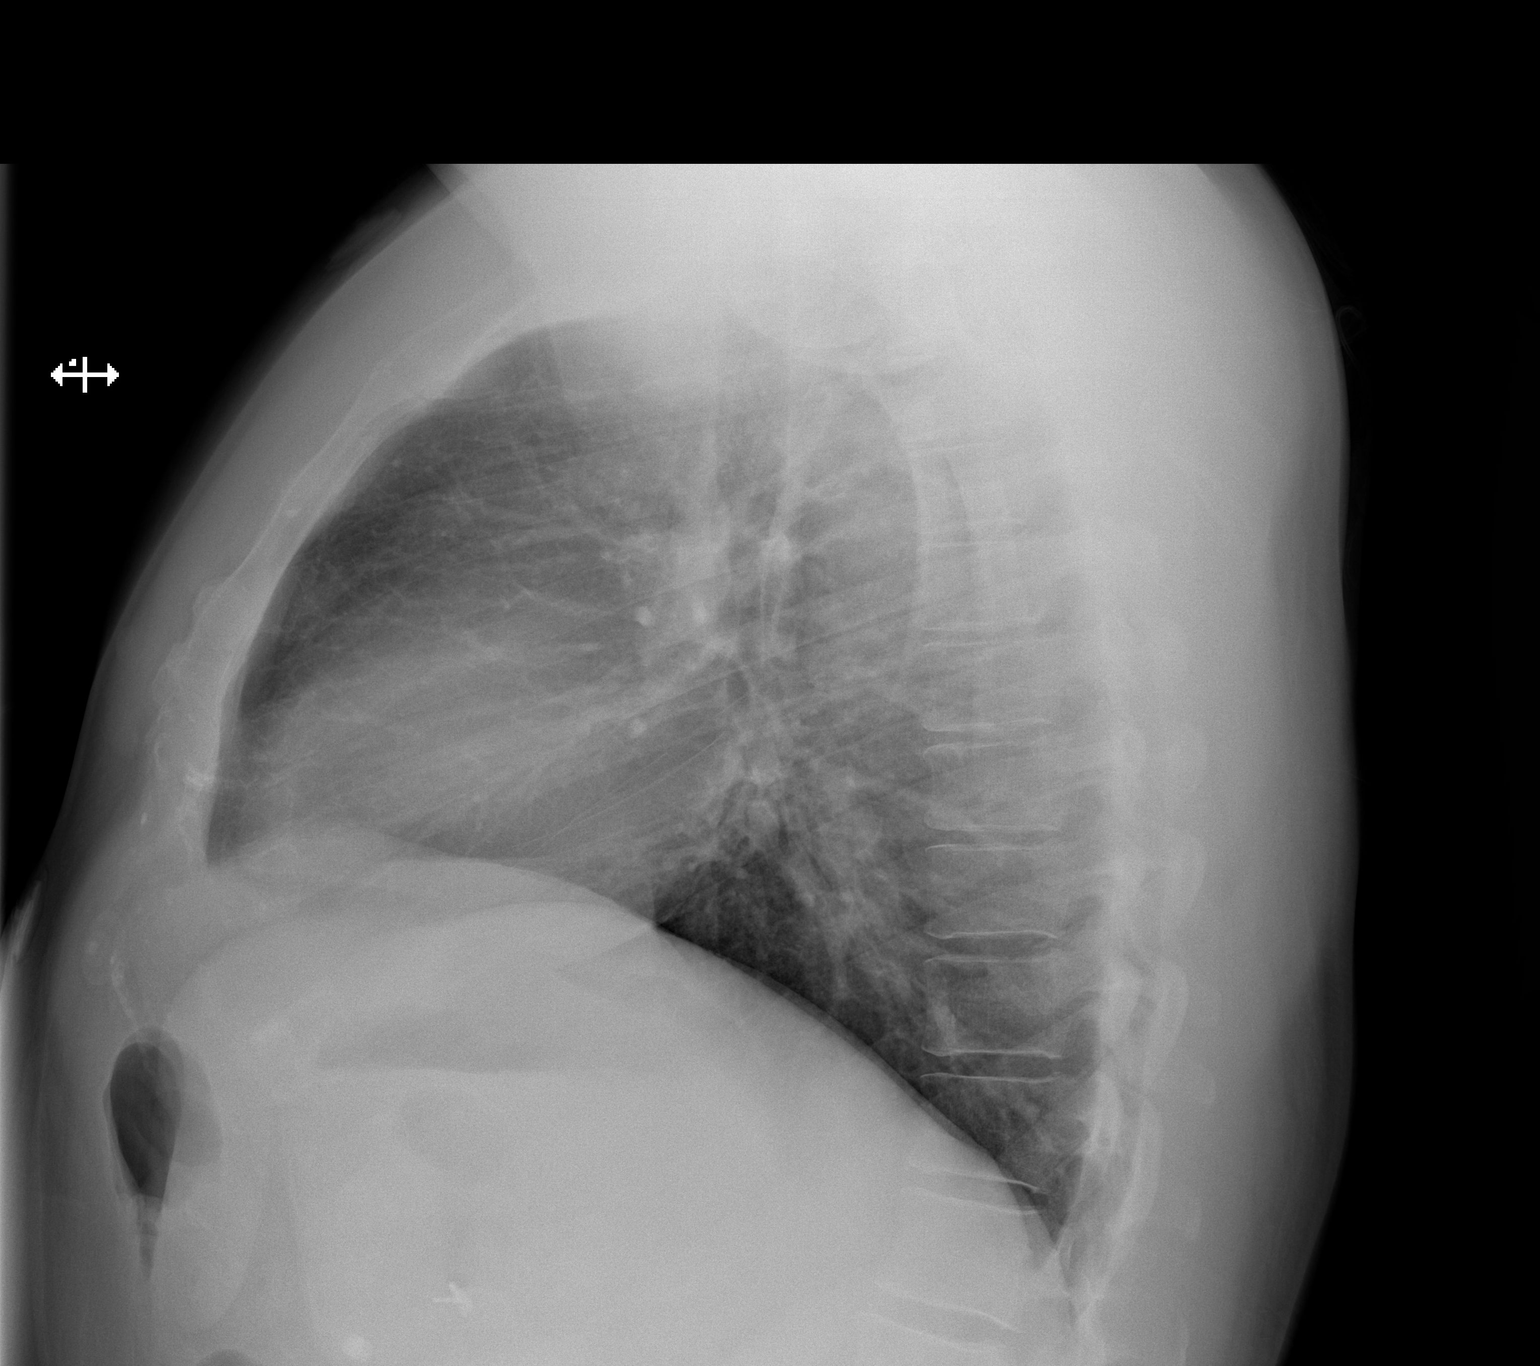

[2 of 2 positions shown; findings below may reference images not displayed]

FINDINGS: The heart size and mediastinal contours are within normal limits.
Both lungs are clear. The visualized skeletal structures are
unremarkable.
IMPRESSION: No active cardiopulmonary disease.

## 2023-03-25 NOTE — Telephone Encounter (Signed)
 Pharmacy Patient Advocate Encounter   Received notification from [employer]base that prior authorization for Ozempic (0.25 or 0.5 MG/DOSE) 2MG /3ML pen-injectors is required/requested.   Insurance verification completed.   The patient is insured through [employer] .   Per test claim: PA required; PA submitted to above mentioned insurance via CoverMyMeds Key/confirmation #/EOC [id number] Status is pending

## 2023-03-30 NOTE — Telephone Encounter (Signed)
 Pharmacy Patient Advocate Encounter  Received notification from [employer] that Prior Authorization for Ozempic has been APPROVED from 02/23/2023 to 03/24/2024   PA #/Case ID/Reference #: [id number]

## 2023-06-16 ENCOUNTER — Other Ambulatory Visit: Payer: Self-pay | Admitting: Family Medicine

## 2023-06-16 ENCOUNTER — Other Ambulatory Visit (INDEPENDENT_AMBULATORY_CARE_PROVIDER_SITE_OTHER): Payer: No Typology Code available for payment source

## 2023-06-16 ENCOUNTER — Other Ambulatory Visit: Payer: Self-pay | Admitting: Medical Genetics

## 2023-06-16 DIAGNOSIS — R197 Diarrhea, unspecified: Secondary | ICD-10-CM

## 2023-06-16 DIAGNOSIS — K9189 Other postprocedural complications and disorders of digestive system: Secondary | ICD-10-CM

## 2023-06-16 DIAGNOSIS — E785 Hyperlipidemia, unspecified: Secondary | ICD-10-CM | POA: Diagnosis not present

## 2023-06-16 DIAGNOSIS — E1169 Type 2 diabetes mellitus with other specified complication: Secondary | ICD-10-CM

## 2023-06-16 DIAGNOSIS — Z794 Long term (current) use of insulin: Secondary | ICD-10-CM | POA: Diagnosis not present

## 2023-06-16 DIAGNOSIS — E1165 Type 2 diabetes mellitus with hyperglycemia: Secondary | ICD-10-CM | POA: Diagnosis not present

## 2023-06-16 DIAGNOSIS — R5383 Other fatigue: Secondary | ICD-10-CM | POA: Diagnosis not present

## 2023-06-16 LAB — COMPREHENSIVE METABOLIC PANEL WITH GFR
ALT: 77 U/L — ABNORMAL HIGH (ref 0–53)
AST: 46 U/L — ABNORMAL HIGH (ref 0–37)
Albumin: 4.8 g/dL (ref 3.5–5.2)
Alkaline Phosphatase: 110 U/L (ref 39–117)
BUN: 14 mg/dL (ref 6–23)
CO2: 31 meq/L (ref 19–32)
Calcium: 9.8 mg/dL (ref 8.4–10.5)
Chloride: 96 meq/L (ref 96–112)
Creatinine, Ser: 1.21 mg/dL (ref 0.40–1.50)
GFR: 72.71 mL/min (ref >60.00)
Glucose, Bld: 152 mg/dL — ABNORMAL HIGH (ref 70–99)
Potassium: 4.3 meq/L (ref 3.5–5.1)
Sodium: 134 meq/L — ABNORMAL LOW (ref 135–145)
Total Bilirubin: 1 mg/dL (ref 0.2–1.2)
Total Protein: 7.9 g/dL (ref 6.0–8.3)

## 2023-06-16 LAB — TESTOSTERONE: Testosterone: 318.05 ng/dL (ref 300.00–890.00)

## 2023-06-16 LAB — CBC
HCT: 45.1 % (ref 39.0–52.0)
Hemoglobin: 15 g/dL (ref 13.0–17.0)
MCHC: 33.4 g/dL (ref 30.0–36.0)
MCV: 82.8 fl (ref 78.0–100.0)
Platelets: 282 10*3/uL (ref 150.0–400.0)
RBC: 5.45 Mil/uL (ref 4.22–5.81)
RDW: 12.9 % (ref 11.5–15.5)
WBC: 7.8 10*3/uL (ref 4.0–10.5)

## 2023-06-16 LAB — LIPID PANEL
Cholesterol: 204 mg/dL — ABNORMAL HIGH (ref 0–200)
HDL: 48.3 mg/dL (ref >39.00)
LDL Cholesterol: 131 mg/dL — ABNORMAL HIGH (ref 0–99)
NonHDL: 155.26
Total CHOL/HDL Ratio: 4
Triglycerides: 120 mg/dL (ref 0.0–149.0)
VLDL: 24 mg/dL (ref 0.0–40.0)

## 2023-06-16 LAB — MICROALBUMIN / CREATININE URINE RATIO
Creatinine,U: 340.8 mg/dL
Microalb Creat Ratio: 17 mg/g (ref 0.0–30.0)
Microalb, Ur: 5.8 mg/dL — ABNORMAL HIGH (ref 0.0–1.9)

## 2023-06-16 LAB — HEMOGLOBIN A1C: Hgb A1c MFr Bld: 9.1 % — ABNORMAL HIGH (ref 4.6–6.5)

## 2023-06-16 LAB — VITAMIN B12: Vitamin B-12: 335 pg/mL (ref 211–911)

## 2023-06-16 NOTE — Addendum Note (Signed)
 Addended by: Gerry Krone on: 06/16/2023 08:29 AM   Modules accepted: Orders

## 2023-06-17 ENCOUNTER — Ambulatory Visit: Payer: Self-pay | Admitting: Family Medicine

## 2023-06-23 ENCOUNTER — Encounter: Payer: Self-pay | Admitting: Family Medicine

## 2023-06-23 ENCOUNTER — Ambulatory Visit (INDEPENDENT_AMBULATORY_CARE_PROVIDER_SITE_OTHER): Payer: No Typology Code available for payment source | Admitting: Family Medicine

## 2023-06-23 VITALS — BP 136/86 | HR 86 | Temp 98.2°F | Ht 69.5 in | Wt 204.1 lb

## 2023-06-23 DIAGNOSIS — R7401 Elevation of levels of liver transaminase levels: Secondary | ICD-10-CM | POA: Insufficient documentation

## 2023-06-23 DIAGNOSIS — R1011 Right upper quadrant pain: Secondary | ICD-10-CM | POA: Diagnosis not present

## 2023-06-23 DIAGNOSIS — E785 Hyperlipidemia, unspecified: Secondary | ICD-10-CM

## 2023-06-23 DIAGNOSIS — E538 Deficiency of other specified B group vitamins: Secondary | ICD-10-CM | POA: Diagnosis not present

## 2023-06-23 DIAGNOSIS — I10 Essential (primary) hypertension: Secondary | ICD-10-CM

## 2023-06-23 DIAGNOSIS — E1169 Type 2 diabetes mellitus with other specified complication: Secondary | ICD-10-CM

## 2023-06-23 DIAGNOSIS — G4733 Obstructive sleep apnea (adult) (pediatric): Secondary | ICD-10-CM

## 2023-06-23 DIAGNOSIS — K760 Fatty (change of) liver, not elsewhere classified: Secondary | ICD-10-CM

## 2023-06-23 DIAGNOSIS — E663 Overweight: Secondary | ICD-10-CM

## 2023-06-23 DIAGNOSIS — Z794 Long term (current) use of insulin: Secondary | ICD-10-CM

## 2023-06-23 DIAGNOSIS — Z0000 Encounter for general adult medical examination without abnormal findings: Secondary | ICD-10-CM

## 2023-06-23 DIAGNOSIS — E1165 Type 2 diabetes mellitus with hyperglycemia: Secondary | ICD-10-CM | POA: Diagnosis not present

## 2023-06-23 DIAGNOSIS — R5383 Other fatigue: Secondary | ICD-10-CM

## 2023-06-23 MED ORDER — SEMAGLUTIDE(0.25 OR 0.5MG/DOS) 2 MG/3ML ~~LOC~~ SOPN
0.5000 mg | PEN_INJECTOR | SUBCUTANEOUS | 1 refills | Status: DC
Start: 2023-06-23 — End: 2023-12-13

## 2023-06-23 MED ORDER — FREESTYLE LIBRE 3 PLUS SENSOR MISC: 1 refills | Status: DC

## 2023-06-23 MED ORDER — CYANOCOBALAMIN 1000 MCG/ML IJ SOLN
1000.0000 ug | Freq: Once | INTRAMUSCULAR | Status: AC
  Administered 2023-06-23: 1000 ug via INTRAMUSCULAR

## 2023-06-23 NOTE — Assessment & Plan Note (Addendum)
 Notes ongoing trouble. Will provide with B12 shot today, update with effect.

## 2023-06-23 NOTE — Assessment & Plan Note (Signed)
 Pt remains hesitant for statin. Will continue to discuss  The 10-year ASCVD risk score (Arnett DK, et al., 2019) is: 4.2%   Values used to calculate the score:     Age: 43 years     Sex: Male     Is Non-Hispanic African American: No     Diabetic: Yes     Tobacco smoker: No     Systolic Blood Pressure: 136 mmHg     Is BP treated: No     HDL Cholesterol: 48.3 mg/dL     Total Cholesterol: 204 mg/dL

## 2023-06-23 NOTE — Progress Notes (Signed)
 Ph: [phone] Fax: [phone]   Patient ID: Alan Barber, male    DOB: July 18, 1980, 43 y.o.   MRN: [id number]  This visit was conducted in person.  BP 136/86   Pulse 86   Temp 98.2 F (36.8 C) (Oral)   Ht 5' 9.5" (1.765 m)   Wt 204 lb 2 oz (92.6 kg)   SpO2 97%   BMI 29.71 kg/m    CC: CPE Subjective:   HPI: Alan Barber is a 43 y.o. male presenting on 06/23/2023 for Annual Exam   OSA - continues CPAP, sees Dr Baldwin Levee. AutoPAP setting.  [facility].    S/p cholecystectomy 2017 - has had loose stools since then - overall better off metformin    DM - trouble tolerating metformin  XR. He's not been using semglee  insulin  vials. Very active during current soccer season then becomes more sedentary. Notes increasing need for insulin . Last visit we started ozempic  0.5mg  weekly - trouble tolerating 0.5mg  dose due to diarrhea, sulfur burps, nausea, gassiness - feels this is slowly improving so desires to continue this dose. No epigastric pain but did have RUQ pain. A1c remains uncontrolled. Has declined diabetes education classes. Last eye exam 12/2021 - due.  CGM data: freestyle libre 3 30 day average sugar: 195, time in range 43%, hypoglycemia 0%, stage 1 hyperglycemia 42%, stage 2 hyperglycemia 15%.    Planning to start playing soccer this summer in a league.   Preventative: Flu shot - declined COVID vaccine - discussed, declined Pneumococcal - declines Td 2008, Tdap 02/2019  Seat belt use discussed. Sunscreen use discussed, no changing moles on skin.  Sleep - averaging 6-7 hours/night  Non smoking  Alcohol - none  Dentist - yearly  Eye exam - due  Caffeine: 32oz Dr Kathlene Paradise Zero  Occupation: physical therapist, [employer]  Lives with wife and 2 children (2009, 2014) Edu: Grad school, doctor of PT  Activity: occasional exercise Diet: good water, fruits/vegetables daily      Relevant past medical, surgical, family  and social history reviewed and updated as indicated. Interim medical history since our last visit reviewed. Allergies and medications reviewed and updated. Outpatient Medications Prior to Visit  Medication Sig Dispense Refill   Blood Glucose Monitoring Suppl (ONE TOUCH ULTRA 2) w/Device KIT Use as instructed to check blood sugar once a day 1 kit 0   fluticasone  (FLONASE ) 50 MCG/ACT nasal spray Place 2 sprays into both nostrils daily as needed for allergies or rhinitis.     glucose blood (ONETOUCH ULTRA) test strip Use as instructed to check blood sugar once a day 100 each 0   insulin  glargine-yfgn (SEMGLEE ) 100 UNIT/ML injection Inject 0.34 mLs (34 Units total) into the skin daily. 40 mL 3   Insulin  Syringe-Needle U-100 30G X 1/2" 0.5 ML MISC Use as directed to inject insulin  daily 100 each 3   Lancets (ONETOUCH DELICA PLUS LANCET33G) MISC Use as instructed to check blood sugar once a day 100 each 0   loratadine (CLARITIN) 10 MG tablet Take 10 mg by mouth daily.     Probiotic Product (PROBIOTIC DAILY PO) Take by mouth daily.     Continuous Glucose Sensor (FREESTYLE LIBRE 3 PLUS SENSOR) MISC Change sensor every 15 days. 6 each 3   Semaglutide ,0.25 or 0.5MG /DOS, 2 MG/3ML SOPN Inject 0.5 mg into the skin once a week. 9 mL 1   insulin  glargine-yfgn (SEMGLEE , YFGN,) 100 UNIT/ML Pen INJECT 34 UNITS UNDER THE  SKIN DAILY 45 mL 3   Insulin  Pen Needle (BD PEN NEEDLE NANO U/F) 32G X 4 MM MISC Use to inject insulin  daily 100 each 3   Semaglutide ,0.25 or 0.5MG /DOS, (OZEMPIC , 0.25 OR 0.5 MG/DOSE,) 2 MG/3ML SOPN Inject 0.25 mg into the skin once a week. 3 mL 0   No facility-administered medications prior to visit.     Per HPI unless specifically indicated in ROS section below Review of Systems  Constitutional:  Negative for activity change, appetite change, chills, fatigue, fever and unexpected weight change.  HENT:  Negative for hearing loss.   Eyes:  Negative for visual disturbance.  Respiratory:   Negative for cough, chest tightness, shortness of breath and wheezing.   Cardiovascular:  Negative for chest pain, palpitations and leg swelling.  Gastrointestinal:  Positive for abdominal pain (RUQ x 24 hours throbbing, then resolved), diarrhea and nausea. Negative for abdominal distention, blood in stool, constipation and vomiting.  Genitourinary:  Negative for difficulty urinating and hematuria.  Musculoskeletal:  Negative for arthralgias, myalgias and neck pain.  Skin:  Negative for rash.  Neurological:  Positive for headaches. Negative for dizziness, seizures and syncope.  Hematological:  Negative for adenopathy. Does not bruise/bleed easily.  Psychiatric/Behavioral:  Negative for dysphoric mood. The patient is not nervous/anxious.     Objective:  BP 136/86   Pulse 86   Temp 98.2 F (36.8 C) (Oral)   Ht 5' 9.5" (1.765 m)   Wt 204 lb 2 oz (92.6 kg)   SpO2 97%   BMI 29.71 kg/m   Wt Readings from Last 3 Encounters:  06/23/23 204 lb 2 oz (92.6 kg)  03/22/23 206 lb 6 oz (93.6 kg)  10/23/22 212 lb 6 oz (96.3 kg)      Physical Exam Vitals and nursing note reviewed.  Constitutional:      General: He is not in acute distress.    Appearance: Normal appearance. He is well-developed. He is not ill-appearing.  HENT:     Head: Normocephalic and atraumatic.     Right Ear: Hearing, tympanic membrane, ear canal and external ear normal.     Left Ear: Hearing, tympanic membrane, ear canal and external ear normal.     Mouth/Throat:     Mouth: Mucous membranes are moist.     Pharynx: Oropharynx is clear. No oropharyngeal exudate or posterior oropharyngeal erythema.  Eyes:     General: No scleral icterus.    Extraocular Movements: Extraocular movements intact.     Conjunctiva/sclera: Conjunctivae normal.     Pupils: Pupils are equal, round, and reactive to light.  Neck:     Thyroid : No thyroid  mass or thyromegaly.     Vascular: No carotid bruit.  Cardiovascular:     Rate and Rhythm:  Normal rate and regular rhythm.     Pulses: Normal pulses.          Radial pulses are 2+ on the right side and 2+ on the left side.     Heart sounds: Normal heart sounds. No murmur heard. Pulmonary:     Effort: Pulmonary effort is normal. No respiratory distress.     Breath sounds: Normal breath sounds. No wheezing, rhonchi or rales.  Abdominal:     General: Bowel sounds are normal. There is no distension.     Palpations: Abdomen is soft. There is no mass.     Tenderness: There is no abdominal tenderness. There is no guarding or rebound.     Hernia: No hernia is present.  Musculoskeletal:        General: Normal range of motion.     Cervical back: Normal range of motion and neck supple.     Right lower leg: No edema.     Left lower leg: No edema.  Lymphadenopathy:     Cervical: No cervical adenopathy.  Skin:    General: Skin is warm and dry.     Findings: No rash.  Neurological:     General: No focal deficit present.     Mental Status: He is alert and oriented to person, place, and time.  Psychiatric:        Mood and Affect: Mood normal.        Behavior: Behavior normal.        Thought Content: Thought content normal.        Judgment: Judgment normal.       Results for orders placed or performed in visit on 06/16/23  CBC   Collection Time: 06/16/23  8:22 AM  Result Value Ref Range   WBC 7.8 4.0 - 10.5 K/uL   RBC 5.45 4.22 - 5.81 Mil/uL   Platelets 282.0 150.0 - 400.0 K/uL   Hemoglobin 15.0 13.0 - 17.0 g/dL   HCT 40.9 81.1 - 91.4 %   MCV 82.8 78.0 - 100.0 fl   MCHC 33.4 30.0 - 36.0 g/dL   RDW 78.2 95.6 - 21.3 %  Vitamin B12   Collection Time: 06/16/23  8:22 AM  Result Value Ref Range   Vitamin B-12 335 211 - 911 pg/mL  Comprehensive metabolic panel with GFR   Collection Time: 06/16/23  8:22 AM  Result Value Ref Range   Sodium 134 (L) 135 - 145 mEq/L   Potassium 4.3 3.5 - 5.1 mEq/L   Chloride 96 96 - 112 mEq/L   CO2 31 19 - 32 mEq/L   Glucose, Bld 152 (H) 70 - 99  mg/dL   BUN 14 6 - 23 mg/dL   Creatinine, Ser 0.86 0.40 - 1.50 mg/dL   Total Bilirubin 1.0 0.2 - 1.2 mg/dL   Alkaline Phosphatase 110 39 - 117 U/L   AST 46 (H) 0 - 37 U/L   ALT 77 (H) 0 - 53 U/L   Total Protein 7.9 6.0 - 8.3 g/dL   Albumin 4.8 3.5 - 5.2 g/dL   GFR 57.84 >69.62 mL/min   Calcium 9.8 8.4 - 10.5 mg/dL  Lipid panel   Collection Time: 06/16/23  8:22 AM  Result Value Ref Range   Cholesterol 204 (H) 0 - 200 mg/dL   Triglycerides 952.8 0.0 - 149.0 mg/dL   HDL 41.32 >44.01 mg/dL   VLDL 02.7 0.0 - 25.3 mg/dL   LDL Cholesterol 664 (H) 0 - 99 mg/dL   Total CHOL/HDL Ratio 4    NonHDL 155.26   Microalbumin / creatinine urine ratio   Collection Time: 06/16/23  8:22 AM  Result Value Ref Range   Microalb, Ur 5.8 (H) 0.0 - 1.9 mg/dL   Creatinine,U 403.4 mg/dL   Microalb Creat Ratio 17.0 0.0 - 30.0 mg/g  Hemoglobin A1c   Collection Time: 06/16/23  8:22 AM  Result Value Ref Range   Hgb A1c MFr Bld 9.1 (H) 4.6 - 6.5 %  Testosterone    Collection Time: 06/16/23  8:22 AM  Result Value Ref Range   Testosterone  318.05 300.00 - 890.00 ng/dL    Assessment & Plan:   Problem List Items Addressed This Visit     Healthcare maintenance - Primary (Chronic)  Preventative protocols reviewed and updated unless pt declined. Discussed healthy diet and lifestyle.       Dyslipidemia associated with type 2 diabetes mellitus (HCC)   Pt remains hesitant for statin. Will continue to discuss  The 10-year ASCVD risk score (Arnett DK, et al., 2019) is: 4.2%   Values used to calculate the score:     Age: 61 years     Sex: Male     Is Non-Hispanic African American: No     Diabetic: Yes     Tobacco smoker: No     Systolic Blood Pressure: 136 mmHg     Is BP treated: No     HDL Cholesterol: 48.3 mg/dL     Total Cholesterol: 204 mg/dL       Relevant Medications   Semaglutide ,0.25 or 0.5MG /DOS, 2 MG/3ML SOPN   Overweight with body mass index (BMI) 25.0-29.9   Weight loss noted.        Fatty liver disease, nonalcoholic   H/o this by prior imaging.       RUQ abdominal pain   Intermittent, persistent after cholecystectomy 2013.  Lasted 24h, now resolved. Update labs in 6 wks.  Update sooner if new or worsening abd pain.       Relevant Orders   Comprehensive metabolic panel with GFR   Lipase   OSA on CPAP   On AutoCPAP through [facility].       Primary hypertension   Stable off medication - will continue to monitor.      Type 2 diabetes mellitus with hyperglycemia, with long-term current use of insulin  (HCC)   Chronic, slowly improving but still above goal <7%.  Recommend restart daily insulin  at 10u with titration as needed (2u if 3d average fasting sugar >150).  Continue ozempic  0.5mg  weekly, no titration due to poor initial tolerability.  RTC 6 wks fructosamine check, 3 months DM f/u visit.       Relevant Medications   Semaglutide ,0.25 or 0.5MG /DOS, 2 MG/3ML SOPN   Other Relevant Orders   Fructosamine   Fatigue   Notes ongoing trouble. Will provide with B12 shot today, update with effect.       Transaminitis   New. ?Ozempic  related.  Recheck in 6 wks with lipase.  Update sooner if new or worsening symptoms.       Relevant Orders   Comprehensive metabolic panel with GFR   Lipase   Low serum vitamin B12   Low normal reading with B12 335.  Oral b12 wasn't really helpful previously for symptoms.  Provide IM B12 shot today, update with effect.         Meds ordered this encounter  Medications   Semaglutide ,0.25 or 0.5MG /DOS, 2 MG/3ML SOPN    Sig: Inject 0.5 mg into the skin once a week.    Dispense:  9 mL    Refill:  1   Continuous Glucose Sensor (FREESTYLE LIBRE 3 PLUS SENSOR) MISC    Sig: Change sensor every 15 days.    Dispense:  6 each    Refill:  1   cyanocobalamin  (VITAMIN B12) injection 1,000 mcg    Orders Placed This Encounter  Procedures   Comprehensive metabolic panel with GFR    Standing Status:   Future    Expiration Date:    06/22/2024   Fructosamine    Standing Status:   Future    Expiration Date:   06/22/2024   Lipase    Standing Status:   Future  Expiration Date:   06/22/2024    Patient Instructions  B12 shot today  Start daily insulin  injections, start at 10 units daily, titrate up or down according to fasting sugar levels.  Schedule eye exam.  Schedule lab visit in 6 weeks to recheck liver.  Return to see me in 3 months for diabetes follow up visit   Follow up plan: Return in about 3 months (around 09/23/2023) for follow up visit.  Claire Crick, MD

## 2023-06-23 NOTE — Assessment & Plan Note (Signed)
 Chronic, slowly improving but still above goal <7%.  Recommend restart daily insulin  at 10u with titration as needed (2u if 3d average fasting sugar >150).  Continue ozempic  0.5mg  weekly, no titration due to poor initial tolerability.  RTC 6 wks fructosamine check, 3 months DM f/u visit.

## 2023-06-23 NOTE — Assessment & Plan Note (Signed)
 Stable off medication - will continue to monitor.

## 2023-06-23 NOTE — Assessment & Plan Note (Signed)
Weight loss noted  

## 2023-06-23 NOTE — Assessment & Plan Note (Signed)
 New. ?Ozempic  related.  Recheck in 6 wks with lipase.  Update sooner if new or worsening symptoms.

## 2023-06-23 NOTE — Patient Instructions (Addendum)
 B12 shot today  Start daily insulin  injections, start at 10 units daily, titrate up or down according to fasting sugar levels.  Schedule eye exam.  Schedule lab visit in 6 weeks to recheck liver.  Return to see me in 3 months for diabetes follow up visit

## 2023-06-23 NOTE — Assessment & Plan Note (Signed)
 Intermittent, persistent after cholecystectomy 2013.  Lasted 24h, now resolved. Update labs in 6 wks.  Update sooner if new or worsening abd pain.

## 2023-06-23 NOTE — Assessment & Plan Note (Signed)
 On AutoCPAP through [facility].

## 2023-06-23 NOTE — Assessment & Plan Note (Addendum)
 Low normal reading with B12 335.  Oral b12 wasn't really helpful previously for symptoms.  Provide IM B12 shot today, update with effect.

## 2023-06-23 NOTE — Assessment & Plan Note (Signed)
 H/o this by prior imaging.

## 2023-06-23 NOTE — Assessment & Plan Note (Signed)
 Preventative protocols reviewed and updated unless pt declined. Discussed healthy diet and lifestyle.

## 2023-08-10 ENCOUNTER — Other Ambulatory Visit (INDEPENDENT_AMBULATORY_CARE_PROVIDER_SITE_OTHER)

## 2023-08-10 DIAGNOSIS — R7401 Elevation of levels of liver transaminase levels: Secondary | ICD-10-CM | POA: Diagnosis not present

## 2023-08-10 DIAGNOSIS — R1011 Right upper quadrant pain: Secondary | ICD-10-CM

## 2023-08-10 DIAGNOSIS — Z794 Long term (current) use of insulin: Secondary | ICD-10-CM

## 2023-08-10 LAB — COMPREHENSIVE METABOLIC PANEL WITH GFR
ALT: 33 U/L (ref 0–53)
AST: 21 U/L (ref 0–37)
Albumin: 4.7 g/dL (ref 3.5–5.2)
Alkaline Phosphatase: 85 U/L (ref 39–117)
BUN: 13 mg/dL (ref 6–23)
CO2: 31 meq/L (ref 19–32)
Calcium: 9.4 mg/dL (ref 8.4–10.5)
Chloride: 97 meq/L (ref 96–112)
Creatinine, Ser: 1.09 mg/dL (ref 0.40–1.50)
GFR: 82.33 mL/min (ref >60.00)
Glucose, Bld: 166 mg/dL — ABNORMAL HIGH (ref 70–99)
Potassium: 4.3 meq/L (ref 3.5–5.1)
Sodium: 134 meq/L — ABNORMAL LOW (ref 135–145)
Total Bilirubin: 0.7 mg/dL (ref 0.2–1.2)
Total Protein: 7.3 g/dL (ref 6.0–8.3)

## 2023-08-10 LAB — LIPASE: Lipase: 29 U/L (ref 11.0–59.0)

## 2023-08-13 LAB — FRUCTOSAMINE: Fructosamine: 357 umol/L — ABNORMAL HIGH (ref 205–285)

## 2023-08-16 ENCOUNTER — Ambulatory Visit: Payer: Self-pay | Admitting: Family Medicine

## 2023-08-19 ENCOUNTER — Other Ambulatory Visit

## 2023-08-19 DIAGNOSIS — Z006 Encounter for examination for normal comparison and control in clinical research program: Secondary | ICD-10-CM

## 2023-08-30 LAB — GENECONNECT MOLECULAR SCREEN: Genetic Analysis Overall Interpretation: NEGATIVE

## 2023-09-28 ENCOUNTER — Encounter: Payer: Self-pay | Admitting: Family Medicine

## 2023-09-28 ENCOUNTER — Ambulatory Visit (INDEPENDENT_AMBULATORY_CARE_PROVIDER_SITE_OTHER): Admitting: Family Medicine

## 2023-09-28 VITALS — BP 130/90 | HR 73 | Temp 98.3°F | Ht 69.5 in | Wt 212.4 lb

## 2023-09-28 DIAGNOSIS — K760 Fatty (change of) liver, not elsewhere classified: Secondary | ICD-10-CM

## 2023-09-28 DIAGNOSIS — I10 Essential (primary) hypertension: Secondary | ICD-10-CM

## 2023-09-28 DIAGNOSIS — Z794 Long term (current) use of insulin: Secondary | ICD-10-CM | POA: Diagnosis not present

## 2023-09-28 DIAGNOSIS — G4733 Obstructive sleep apnea (adult) (pediatric): Secondary | ICD-10-CM | POA: Diagnosis not present

## 2023-09-28 DIAGNOSIS — E1165 Type 2 diabetes mellitus with hyperglycemia: Secondary | ICD-10-CM

## 2023-09-28 DIAGNOSIS — E1169 Type 2 diabetes mellitus with other specified complication: Secondary | ICD-10-CM | POA: Diagnosis not present

## 2023-09-28 DIAGNOSIS — E66811 Obesity, class 1: Secondary | ICD-10-CM

## 2023-09-28 DIAGNOSIS — E785 Hyperlipidemia, unspecified: Secondary | ICD-10-CM

## 2023-09-28 LAB — POCT GLYCOSYLATED HEMOGLOBIN (HGB A1C): Hemoglobin A1C: 7.8 % — AB (ref 4.0–5.6)

## 2023-09-28 NOTE — Assessment & Plan Note (Signed)
 Continue regular CPAP use, followed by pulmonology.

## 2023-09-28 NOTE — Assessment & Plan Note (Signed)
 Chronic, BP noted elevated, discussed possible ACE/ARB. Recommend continue monitoring at home, let me know if blood pressure consistently running over 140/90 for medication commencement.

## 2023-09-28 NOTE — Assessment & Plan Note (Addendum)
 Chronic, continues slowly improving but still above goal <7%. Not regular with semglee  basal insulin  due to developing hypoglycemia. Rec drop dose from 30u to 25u daily regularly and monitor effect of regular insulin  administration.  Continue with Ozempic  0.5 mg weekly which is tolerated well Discussed possible addition of actos  Encouraged he schedule DM eye exam as due RTC 3-4 mo DM f/u visit

## 2023-09-28 NOTE — Assessment & Plan Note (Signed)
 Reviewed indication for statin in diabetic - he remains hesitant. The 10-year ASCVD risk score (Arnett DK, et al., 2019) is: 4.3%   Values used to calculate the score:     Age: 43 years     Clincally relevant sex: Male     Is Non-Hispanic African American: No     Diabetic: Yes     Tobacco smoker: No     Systolic Blood Pressure: 130 mmHg     Is BP treated: No     HDL Cholesterol: 48.3 mg/dL     Total Cholesterol: 204 mg/dL

## 2023-09-28 NOTE — Assessment & Plan Note (Signed)
 Fatty liver by remote ultrasound 2010. Recent transaminitis has resolved on repeat testing last month. Discussed possible Actos use for fatty liver, will not start at this time.

## 2023-09-28 NOTE — Patient Instructions (Addendum)
 Schedule eye exam Delon).  Be more regular with Semglee  insulin  - if hypoglycemia develops, drop units/day.  Keep an eye on blood pressures at home, let me know if consistently >140/90.  Return in 3-4 months for follow up visit on diabetes

## 2023-09-28 NOTE — Assessment & Plan Note (Addendum)
 Continue to encourage healthy diet and lifestyle changes to effect sustainable weight loss.

## 2023-09-28 NOTE — Progress Notes (Signed)
 Ph: [phone] Fax: [phone]   Patient ID: Alan Barber, male    DOB: 1981/01/18, 43 y.o.   MRN: [id number]  This visit was conducted in person.  BP (!) 130/90   Pulse 73   Temp 98.3 F (36.8 C) (Oral)   Ht 5' 9.5 (1.765 m)   Wt 212 lb 6 oz (96.3 kg)   SpO2 96%   BMI 30.91 kg/m   146/90 on repeat testing  CC: DM f/u visit  Subjective:   HPI: Alan Barber is a 43 y.o. male presenting on 09/28/2023 for Medical Management of Chronic Issues (Pt here for 3 mth DM f/u)   DM - does regularly check sugars 200 or less. Compliant with antihyperglycemic regimen which includes: ozempic  0.5mg  weekly, new start semglee  30u daily. Occ ozempic  induced nausea ie after fatty meal but overall tolerating well. Denies low sugars or hypoglycemic symptoms. Denies paresthesias. Occ blurry vision attributed to dry eyes. Last diabetic eye exam DUE - rec schedule. Glucometer brand: one touch delica. Last foot exam: 03/2023. DSME: declines.  Prevnar-20 - declines Latest fructosamine 357 (8.6% equivalent) 08/10/2023.  CGM data: freestyle libre 3 plus 30 day average sugar: 184, time in range 53%, hypoglycemia 0%, stage 1 hyperglycemia 37%, stage 2 hyperglycemia 10%.  Lab Results  Component Value Date   HGBA1C 7.8 (A) 09/28/2023   Diabetic Foot Exam - Simple   No data filed    Lab Results  Component Value Date   MICROALBUR 5.8 (H) 06/16/2023    HLD - h/o trouble tolerating fenofibrate in the past. Declines statin at this time.  The 10-year ASCVD risk score (Arnett DK, et al., 2019) is: 4.3%   Values used to calculate the score:     Age: 1 years     Clincally relevant sex: Male     Is Non-Hispanic African American: No     Diabetic: Yes     Tobacco smoker: No     Systolic Blood Pressure: 130 mmHg     Is BP treated: No     HDL Cholesterol: 48.3 mg/dL     Total Cholesterol: 204 mg/dL  GeneConnect negative 03/7972.      Relevant past medical, surgical, family and social history  reviewed and updated as indicated. Interim medical history since our last visit reviewed. Allergies and medications reviewed and updated. Outpatient Medications Prior to Visit  Medication Sig Dispense Refill   Blood Glucose Monitoring Suppl (ONE TOUCH ULTRA 2) w/Device KIT Use as instructed to check blood sugar once a day 1 kit 0   Continuous Glucose Sensor (FREESTYLE LIBRE 3 PLUS SENSOR) MISC Change sensor every 15 days. 6 each 1   fluticasone  (FLONASE ) 50 MCG/ACT nasal spray Place 2 sprays into both nostrils daily as needed for allergies or rhinitis.     glucose blood (ONETOUCH ULTRA) test strip Use as instructed to check blood sugar once a day 100 each 0   insulin  glargine-yfgn (SEMGLEE ) 100 UNIT/ML injection Inject 0.34 mLs (34 Units total) into the skin daily. 40 mL 3   Insulin  Syringe-Needle U-100 30G X 1/2 0.5 ML MISC Use as directed to inject insulin  daily 100 each 3   Lancets (ONETOUCH DELICA PLUS LANCET33G) MISC Use as instructed to check blood sugar once a day 100 each 0   loratadine (CLARITIN) 10 MG tablet Take 10 mg by mouth daily.     Probiotic Product (PROBIOTIC DAILY PO) Take by mouth daily.     Semaglutide ,0.25 or 0.5MG /DOS, 2 MG/3ML SOPN  Inject 0.5 mg into the skin once a week. 9 mL 1   No facility-administered medications prior to visit.     Per HPI unless specifically indicated in ROS section below Review of Systems  Objective:  BP (!) 130/90   Pulse 73   Temp 98.3 F (36.8 C) (Oral)   Ht 5' 9.5 (1.765 m)   Wt 212 lb 6 oz (96.3 kg)   SpO2 96%   BMI 30.91 kg/m   Wt Readings from Last 3 Encounters:  09/28/23 212 lb 6 oz (96.3 kg)  06/23/23 204 lb 2 oz (92.6 kg)  03/22/23 206 lb 6 oz (93.6 kg)      Physical Exam Vitals and nursing note reviewed.  Constitutional:      Appearance: Normal appearance. He is not ill-appearing.  HENT:     Head: Normocephalic and atraumatic.     Mouth/Throat:     Mouth: Mucous membranes are moist.     Pharynx: Oropharynx is  clear. No oropharyngeal exudate or posterior oropharyngeal erythema.  Eyes:     Extraocular Movements: Extraocular movements intact.     Conjunctiva/sclera: Conjunctivae normal.     Pupils: Pupils are equal, round, and reactive to light.  Neck:     Thyroid : No thyroid  mass or thyromegaly.  Cardiovascular:     Rate and Rhythm: Normal rate and regular rhythm.     Pulses: Normal pulses.     Heart sounds: Normal heart sounds. No murmur heard. Pulmonary:     Effort: Pulmonary effort is normal. No respiratory distress.     Breath sounds: Normal breath sounds. No wheezing, rhonchi or rales.  Musculoskeletal:     Cervical back: Normal range of motion and neck supple.     Right lower leg: No edema.     Left lower leg: No edema.     Comments: See HPI for foot exam if done  Skin:    General: Skin is warm and dry.     Findings: No rash.  Neurological:     Mental Status: He is alert.  Psychiatric:        Mood and Affect: Mood normal.        Behavior: Behavior normal.       Results for orders placed or performed in visit on 09/28/23  POCT glycosylated hemoglobin (Hb A1C)   Collection Time: 09/28/23  8:20 AM  Result Value Ref Range   Hemoglobin A1C 7.8 (A) 4.0 - 5.6 %   HbA1c POC (<> result, manual entry)     HbA1c, POC (prediabetic range)     HbA1c, POC (controlled diabetic range)     Lab Results  Component Value Date   CHOL 204 (H) 06/16/2023   HDL 48.30 06/16/2023   LDLCALC 131 (H) 06/16/2023   LDLDIRECT 142.0 02/21/2020   TRIG 120.0 06/16/2023   CHOLHDL 4 06/16/2023   Assessment & Plan:   Problem List Items Addressed This Visit     Dyslipidemia associated with type 2 diabetes mellitus (HCC)   Reviewed indication for statin in diabetic - he remains hesitant. The 10-year ASCVD risk score (Arnett DK, et al., 2019) is: 4.3%   Values used to calculate the score:     Age: 29 years     Clincally relevant sex: Male     Is Non-Hispanic African American: No     Diabetic: Yes      Tobacco smoker: No     Systolic Blood Pressure: 130 mmHg     Is BP  treated: No     HDL Cholesterol: 48.3 mg/dL     Total Cholesterol: 204 mg/dL       Obesity, Class I, BMI 30-34.9   Continue to encourage healthy diet and lifestyle changes to effect sustainable weight loss.        Metabolic dysfunction-associated fatty liver disease (MAFLD)   Fatty liver by remote ultrasound 2010. Recent transaminitis has resolved on repeat testing last month. Discussed possible Actos use for fatty liver, will not start at this time.      OSA on CPAP   Continue regular CPAP use, followed by pulmonology.      Primary hypertension   Chronic, BP noted elevated, discussed possible ACE/ARB. Recommend continue monitoring at home, let me know if blood pressure consistently running over 140/90 for medication commencement.      Type 2 diabetes mellitus with hyperglycemia, with long-term current use of insulin  (HCC) - Primary   Chronic, continues slowly improving but still above goal <7%. Not regular with semglee  basal insulin  due to developing hypoglycemia. Rec drop dose from 30u to 25u daily regularly and monitor effect of regular insulin  administration.  Continue with Ozempic  0.5 mg weekly which is tolerated well Discussed possible addition of actos  Encouraged he schedule DM eye exam as due RTC 3-4 mo DM f/u visit       Relevant Orders   POCT glycosylated hemoglobin (Hb A1C) (Completed)     No orders of the defined types were placed in this encounter.   Orders Placed This Encounter  Procedures   POCT glycosylated hemoglobin (Hb A1C)    Patient Instructions  Schedule eye exam Delon).  Be more regular with Semglee  insulin  - if hypoglycemia develops, drop units/day.  Keep an eye on blood pressures at home, let me know if consistently >140/90.  Return in 3-4 months for follow up visit on diabetes   Follow up plan: Return in about 3 months (around 12/29/2023), or if symptoms worsen or  fail to improve, for follow up visit.  Anton Blas, MD

## 2023-11-08 ENCOUNTER — Other Ambulatory Visit: Payer: Self-pay | Admitting: Family Medicine

## 2023-11-08 DIAGNOSIS — Z794 Long term (current) use of insulin: Secondary | ICD-10-CM

## 2023-11-25 ENCOUNTER — Encounter: Payer: Self-pay | Admitting: Family Medicine

## 2023-12-10 ENCOUNTER — Telehealth: Payer: Self-pay

## 2023-12-10 NOTE — Telephone Encounter (Signed)
 Copied from CRM [id number]. Topic: Clinical - Order For Equipment >> Dec 10, 2023 10:09 AM Lavanda D wrote: Reason for CRM: Patient is calling because he is needing a new prescription for CPAP. Patient last seen in 2021 and does not want to come in if not needed. Advised that he may need a visit to renew cpap but please call to advise.

## 2023-12-10 NOTE — Telephone Encounter (Signed)
 Patient has been scheduled to see Dr.Young

## 2023-12-13 ENCOUNTER — Other Ambulatory Visit: Payer: Self-pay | Admitting: Family Medicine

## 2023-12-13 NOTE — Telephone Encounter (Unsigned)
 Copied from CRM [id number]. Topic: Clinical - Medication Refill >> Dec 13, 2023  1:05 PM Nessti S wrote: Medication: Semaglutide ,0.25 or 0.5MG /DOS, 2 MG/3ML SOPN; insulin  glargine-yfgn (SEMGLEE ) 100 UNIT/ML injection  Has the patient contacted their pharmacy? No (Agent: If no, request that the patient contact the pharmacy for the refill. If patient does not wish to contact the pharmacy document the reason why and proceed with request.) (Agent: If yes, when and what did the pharmacy advise?)  This is the patient's preferred pharmacy:  [id number] [address] [facility] - [address] [place] [address] Phone: [phone] Fax: [phone]  Is this the correct pharmacy for this prescription? Yes If no, delete pharmacy and type the correct one.   Has the prescription been filled recently? No  Is the patient out of the medication? Yes. Out of ozempic  but still has insulin    Has the patient been seen for an appointment in the last year OR does the patient have an upcoming appointment? Yes  Can we respond through MyChart? Yes  Agent: Please be advised that Rx refills may take up to 3 business days. We ask that you follow-up with your pharmacy.

## 2023-12-14 MED ORDER — SEMAGLUTIDE(0.25 OR 0.5MG/DOS) 2 MG/3ML ~~LOC~~ SOPN: 0.5000 mg | PEN_INJECTOR | SUBCUTANEOUS | 1 refills | Status: AC

## 2023-12-16 ENCOUNTER — Telehealth: Payer: Self-pay

## 2023-12-16 ENCOUNTER — Other Ambulatory Visit (HOSPITAL_COMMUNITY): Payer: Self-pay

## 2023-12-16 NOTE — Telephone Encounter (Signed)
 Pharmacy Patient Advocate Encounter  Received notification from [facility] that Prior Authorization for Ozempic  2 has been APPROVED from 12/16/23 to 12/15/26. Ran test claim, Copay is $0.00. This test claim was processed through [facility]- copay amounts may vary at other pharmacies due to pharmacy/plan contracts, or as the patient moves through the different stages of their insurance plan.   PA #/Case ID/Reference #: # [id number]

## 2023-12-16 NOTE — Telephone Encounter (Signed)
 Pharmacy Patient Advocate Encounter   Received notification from Onbase that prior authorization for Ozempic  2 is required/requested.   Insurance verification completed.   The patient is insured through [facility].   Per test claim: PA required; PA submitted to above mentioned insurance via Latent Key/confirmation #/EOC [facility] Status is pending   Patient has [employer] also. There is an approved PA that expires 03/24/24 and gives a copay $181.12 for 28 day supply.

## 2023-12-17 NOTE — Telephone Encounter (Signed)
 Mychart message sent to patient.

## 2023-12-27 NOTE — Progress Notes (Signed)
 12/28/23- 43 yoM followed for OSA,, complicated by HTN, GERD, DM2, Obesity HST 04/04/19- AHI(4%) 41.3/hr, desat to 62%, body weight 218 lbs CPAP auto 5-20/ [facility]   ordered 05/25/19 Download compliance- 100%, AHI 4.6/hr Body weight today -208 lbs Originally saw Dr Shelah 2021. No return since. -----Needs new rx for CPAP supplies.  Sleep is good.  No problems with CPAP  CXR 09/19/22 IMPRESSION: 1. No acute intrathoracic process. No evidence of aspirated radiopaque foreign body.  ROS-see HPI  + = positive Constitutional:    weight loss, night sweats, fevers, chills, fatigue, lassitude. HEENT:    headaches, difficulty swallowing, tooth/dental problems, sore throat,       sneezing, itching, ear ache, nasal congestion, post nasal drip, snoring CV:    chest pain, orthopnea, PND, swelling in lower extremities, anasarca,                                   dizziness, palpitations Resp:   shortness of breath with exertion or at rest.                productive cough,   non-productive cough, coughing up of blood.              change in color of mucus.  wheezing.   Skin:    rash or lesions. GI:  No-   heartburn, indigestion, abdominal pain, nausea, vomiting, diarrhea,                 change in bowel habits, loss of appetite GU: dysuria, change in color of urine, no urgency or frequency.   flank pain. MS:   joint pain, stiffness, decreased range of motion, back pain. Neuro-     nothing unusual Psych:  change in mood or affect.  depression or anxiety.   memory loss.  OBJ- Physical Exam General- Alert, Oriented, Affect-appropriate, Distress- none acute Skin- rash-none, lesions- none, excoriation- none Lymphadenopathy- none Head- atraumatic            Eyes- Gross vision intact, PERRLA, conjunctivae and secretions clear            Ears- Hearing, canals-normal            Nose- Clear, no-Septal dev, mucus, polyps, erosion, perforation             Throat- Mallampati III , mucosa clear , drainage- none,  tonsils- atrophic, + teeth Neck- flexible , trachea midline, no stridor , thyroid  nl, carotid no bruit Chest - symmetrical excursion , unlabored           Heart/CV- RRR , no murmur , no gallop  , no rub, nl s1 s2                           - JVD- none , edema- none, stasis changes- none, varices- none           Lung- clear to P&A, wheeze- none, cough- none , dullness-none, rub- none           Chest wall-  Abd-  Br/ Gen/ Rectal- Not done, not indicated Extrem- cyanosis- none, clubbing, none, atrophy- none, strength- nl Neuro- grossly intact to observation

## 2023-12-28 ENCOUNTER — Encounter: Payer: Self-pay | Admitting: Internal Medicine

## 2023-12-28 ENCOUNTER — Ambulatory Visit: Admitting: Internal Medicine

## 2023-12-28 VITALS — BP 128/80 | HR 70 | Temp 97.9°F | Ht 71.0 in | Wt 208.0 lb

## 2023-12-28 DIAGNOSIS — G4733 Obstructive sleep apnea (adult) (pediatric): Secondary | ICD-10-CM | POA: Diagnosis not present

## 2023-12-28 DIAGNOSIS — K219 Gastro-esophageal reflux disease without esophagitis: Secondary | ICD-10-CM

## 2023-12-28 NOTE — Patient Instructions (Signed)
 Order- DME [facility]- Please replace CPAP mask of choice, humidifier supplies. Hoses/filters. Continue AirView/ card. Please replace old CPAP machine when eligible, continue auto 5-20.   Please call if we can help  At checkout, ask front desk to bring you back with a sleep doctor.

## 2023-12-29 ENCOUNTER — Ambulatory Visit: Admitting: Family Medicine

## 2024-01-07 ENCOUNTER — Other Ambulatory Visit: Payer: Self-pay | Admitting: Family Medicine

## 2024-01-07 NOTE — Telephone Encounter (Signed)
 Copied from CRM [id number]. Topic: Clinical - Medication Refill >> Jan 07, 2024  3:43 PM China J wrote: Medication: insulin  glargine-yfgn (SEMGLEE ) 100 UNIT/ML injection  Has the patient contacted their pharmacy? Yes (Agent: If no, request that the patient contact the pharmacy for the refill. If patient does not wish to contact the pharmacy document the reason why and proceed with request.) (Agent: If yes, when and what did the pharmacy advise?)  This is the patient's preferred pharmacy:  [id number] [address] [facility] [facility] - [address] [place] [address] Phone: [phone] Fax: [phone]  Is this the correct pharmacy for this prescription? Yes If no, delete pharmacy and type the correct one.   Has the prescription been filled recently? No  Is the patient out of the medication? Yes  Has the patient been seen for an appointment in the last year OR does the patient have an upcoming appointment? Yes  Can we respond through MyChart? Yes  Agent: Please be advised that Rx refills may take up to 3 business days. We ask that you follow-up with your pharmacy.

## 2024-01-08 NOTE — Assessment & Plan Note (Signed)
 Benefits from CPAP with good compliance and control. Probably getting close to eligible to replace old CPAP machine.  Plan- refill supplies. Replace machine when eligible.

## 2024-01-08 NOTE — Assessment & Plan Note (Signed)
 Reflux precautions endorsed.

## 2024-01-10 MED ORDER — INSULIN GLARGINE-YFGN 100 UNIT/ML ~~LOC~~ SOLN: 34.0000 [IU] | Freq: Every day | SUBCUTANEOUS | 0 refills | Status: DC

## 2024-01-17 ENCOUNTER — Telehealth: Payer: Self-pay | Admitting: Family Medicine

## 2024-01-17 MED ORDER — TRESIBA FLEXTOUCH 100 UNIT/ML ~~LOC~~ SOPN: 34.0000 [IU] | PEN_INJECTOR | Freq: Every day | SUBCUTANEOUS | 1 refills | Status: AC

## 2024-01-17 NOTE — Addendum Note (Signed)
 Addended by: RILLA BALLER on: 01/17/2024 05:14 PM   Modules accepted: Orders

## 2024-01-17 NOTE — Telephone Encounter (Signed)
 Spoke with patient - ok to follow CPAP in office - will just need to verify DME to ensure I have compliance report to review.

## 2024-01-17 NOTE — Telephone Encounter (Signed)
 We did not receive Tresiba  Rx request.  Spoke with patient. Ok to start tresiba  - 34 u daily. Sent to pharmacy to replace Semglee .

## 2024-01-17 NOTE — Telephone Encounter (Signed)
 Copied from CRM #[id number]. Topic: Clinical - Prescription Issue >> Jan 17, 2024  9:03 AM Rea ORN wrote: Reason for CRM: Pt stated that insurance refused to pay for Semglee  insulin . Pt stated the pharmacy sent over a request for Tresiba  but but no new rx has been sent back to the pharmacy.  Please call pt back to advise status, [phone]

## 2024-01-31 ENCOUNTER — Ambulatory Visit: Admitting: Family Medicine

## 2024-03-06 ENCOUNTER — Telehealth: Payer: Self-pay | Admitting: Family Medicine

## 2024-03-06 MED ORDER — TOUJEO SOLOSTAR 300 UNIT/ML ~~LOC~~ SOPN: 34.0000 [IU] | PEN_INJECTOR | Freq: Every day | SUBCUTANEOUS | 2 refills | Status: AC

## 2024-03-06 NOTE — Telephone Encounter (Signed)
 Semglee  previously not covered by insurance. Tresiba  started 01/2024.  I received notice from [facility] that Tresiba  is no longer covered as of 2026.  New preferred basal insulin  is Toujeo .  I have sent in Rx for Toujeo  - please notify pt of this.

## 2024-03-08 NOTE — Telephone Encounter (Signed)
 Unable to reach pt at this time, left detailed VM and informed pt to call with any questions or concerns  Please provide message from provider/office when call is returned from patient.

## 2024-03-29 ENCOUNTER — Ambulatory Visit: Admitting: Family Medicine
